# Patient Record
Sex: Female | Born: 1985 | Race: White | Hispanic: No | Marital: Married | Smoking: Never smoker
Health system: Southern US, Community
[De-identification: ages and names within clinical notes are randomized; demographics above are authoritative.]

## PROBLEM LIST (undated history)

## (undated) DIAGNOSIS — G43909 Migraine, unspecified, not intractable, without status migrainosus: Secondary | ICD-10-CM

## (undated) DIAGNOSIS — J42 Unspecified chronic bronchitis: Secondary | ICD-10-CM

## (undated) DIAGNOSIS — E039 Hypothyroidism, unspecified: Secondary | ICD-10-CM

## (undated) DIAGNOSIS — F419 Anxiety disorder, unspecified: Secondary | ICD-10-CM

## (undated) DIAGNOSIS — I10 Essential (primary) hypertension: Secondary | ICD-10-CM

## (undated) DIAGNOSIS — J189 Pneumonia, unspecified organism: Secondary | ICD-10-CM

## (undated) DIAGNOSIS — Z8679 Personal history of other diseases of the circulatory system: Secondary | ICD-10-CM

## (undated) DIAGNOSIS — E282 Polycystic ovarian syndrome: Secondary | ICD-10-CM

## (undated) HISTORY — PX: HERNIA REPAIR: SHX51

## (undated) HISTORY — PX: GANGLION CYST EXCISION: SHX1691

## (undated) HISTORY — PX: MIDDLE EAR SURGERY: SHX713

## (undated) HISTORY — PX: TYMPANOSTOMY TUBE PLACEMENT: SHX32

## (undated) HISTORY — PX: WISDOM TOOTH EXTRACTION: SHX21

---
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
## 2007-01-11 HISTORY — PX: LAPAROSCOPIC CHOLECYSTECTOMY: SUR755

## 2011-04-24 LAB — HIV ANTIBODY (ROUTINE TESTING W REFLEX): HIV: NONREACTIVE

## 2011-04-30 LAB — ABO/RH

## 2011-04-30 LAB — RUBELLA ANTIBODY, IGM: Rubella: IMMUNE

## 2011-04-30 LAB — RPR: RPR: NONREACTIVE

## 2011-07-23 ENCOUNTER — Inpatient Hospital Stay (HOSPITAL_COMMUNITY): Admission: AD | Admit: 2011-07-23 | Payer: Self-pay | Source: Ambulatory Visit | Admitting: Obstetrics and Gynecology

## 2011-10-09 ENCOUNTER — Encounter (HOSPITAL_COMMUNITY): Payer: Self-pay | Admitting: *Deleted

## 2011-10-09 ENCOUNTER — Inpatient Hospital Stay (HOSPITAL_COMMUNITY)
Admission: AD | Admit: 2011-10-09 | Discharge: 2011-10-09 | Disposition: A | Discharge status: Home / Self Care | Payer: Medicaid Other | Source: Ambulatory Visit | Attending: Obstetrics and Gynecology | Admitting: Obstetrics and Gynecology

## 2011-10-09 DIAGNOSIS — O368190 Decreased fetal movements, unspecified trimester, not applicable or unspecified: Secondary | ICD-10-CM | POA: Insufficient documentation

## 2011-10-09 DIAGNOSIS — E282 Polycystic ovarian syndrome: Secondary | ICD-10-CM

## 2011-10-09 DIAGNOSIS — Z3490 Encounter for supervision of normal pregnancy, unspecified, unspecified trimester: Secondary | ICD-10-CM

## 2011-10-09 DIAGNOSIS — Z8759 Personal history of other complications of pregnancy, childbirth and the puerperium: Secondary | ICD-10-CM

## 2011-10-09 DIAGNOSIS — J45909 Unspecified asthma, uncomplicated: Secondary | ICD-10-CM

## 2011-10-09 DIAGNOSIS — Z98891 History of uterine scar from previous surgery: Secondary | ICD-10-CM

## 2011-10-09 DIAGNOSIS — E039 Hypothyroidism, unspecified: Secondary | ICD-10-CM

## 2011-10-09 HISTORY — DX: Anxiety disorder, unspecified: F41.9

## 2011-10-09 HISTORY — DX: Polycystic ovarian syndrome: E28.2

## 2011-10-09 HISTORY — DX: Hypothyroidism, unspecified: E03.9

## 2011-10-09 NOTE — ED Provider Notes (Signed)
History     Chief Complaint  Patient presents with  . Decreased Fetal Movement   HPI Comments: Pt is a G3P01 at [redacted]w[redacted]d that arrived after calling office with cc of decreased fetal movement yesterday and today. Stated "baby has moved, but not as much as usual" has no other complaints, no ctx, no VB or LOF.  Pregnancy significant for:  1. PCOS 2. Hypothyroidism 3. Asthma 4. Hx c/s for HELLP syndrome at 31wks       Past Medical History  Diagnosis Date  . Asthma   . Polycystic ovarian syndrome   . Anxiety   . Hypothyroidism   . Cystitis     Past Surgical History  Procedure Date  . Cesarean section   . Cyst removed from wrist   . Cholecystectomy   . Tonsillectomy and adenoidectomy   . Eardrum repair   . Wisdom tooth extraction     Family History  Problem Relation Age of Onset  . Anesthesia problems Neg Hx     History  Substance Use Topics  . Smoking status: Never Smoker   . Smokeless tobacco: Never Used  . Alcohol Use: No    Allergies: No Known Allergies  Prescriptions prior to admission  Medication Sig Dispense Refill  . Cholecalciferol (VITAMIN D3) 400 UNITS CAPS Take 1 capsule by mouth daily.        . Docosahexaenoic Acid (DHA COMPLETE PO) Take 1 capsule by mouth daily.        Marland Kitchen levothyroxine (SYNTHROID, LEVOTHROID) 125 MCG tablet Take 125 mcg by mouth daily. On Saturdays and Sundays take 1 and 1/2 tabs (187.17mcg)       . prenatal vitamin w/FE, FA (PRENATAL 1 + 1) 27-1 MG TABS Take 1 tablet by mouth daily.          Review of Systems  All other systems reviewed and are negative.   Physical Exam   Blood pressure 102/54, pulse 93, temperature 98.3 F (36.8 C), temperature source Oral, resp. rate 18, height 5\' 4"  (1.626 m), weight 126.554 kg (279 lb).  Physical Exam  Nursing note and vitals reviewed. Constitutional: She is oriented to person, place, and time. She appears well-developed and well-nourished.  Cardiovascular: Normal rate.   Respiratory:  Effort normal.  Musculoskeletal: Normal range of motion.  Neurological: She is alert and oriented to person, place, and time.  Skin: Skin is warm and dry.  Psychiatric: She has a normal mood and affect. Her behavior is normal.   FHR 130 reactive, audible fetal movement TOCO quiet  MAU Course  Procedures    Assessment and Plan  IUP at 28w NST reactive Pt feels lots of FM now  D/C home F/U routine [facility]   Anders Hohmann M 10/09/2011, 7:41 PM

## 2011-10-09 NOTE — Progress Notes (Signed)
Decreased FM X 2 days

## 2011-11-12 ENCOUNTER — Encounter (HOSPITAL_COMMUNITY): Payer: Self-pay | Admitting: *Deleted

## 2011-11-12 ENCOUNTER — Inpatient Hospital Stay (HOSPITAL_COMMUNITY)
Admission: AD | Admit: 2011-11-12 | Discharge: 2011-11-12 | Disposition: A | Discharge status: Home / Self Care | Payer: Medicaid Other | Source: Ambulatory Visit | Attending: Obstetrics and Gynecology | Admitting: Obstetrics and Gynecology

## 2011-11-12 DIAGNOSIS — O368190 Decreased fetal movements, unspecified trimester, not applicable or unspecified: Secondary | ICD-10-CM | POA: Insufficient documentation

## 2011-11-12 NOTE — Progress Notes (Signed)
Pt here for decreased fm, states she felt mvmt on way to MAU, denies abnormal vaginal d/c changes or bleeding, denies pain also. +FM at present.

## 2011-11-13 NOTE — ED Provider Notes (Signed)
History     Chief Complaint  Patient presents with  . Decreased Fetal Movement   HPI Comments: Pt is a G3P0111 at [redacted]w[redacted]d w cc of decreased FM since last night, has felt some movement throughout the day but not as much as usual. Now in MAU feels lots of movement. Denies any pain, ctx, VB or LOF.       Past Medical History  Diagnosis Date  . Asthma   . Polycystic ovarian syndrome   . Anxiety   . Hypothyroidism   . Cystitis     Past Surgical History  Procedure Date  . Cesarean section   . Cyst removed from wrist   . Cholecystectomy   . Tonsillectomy and adenoidectomy   . Eardrum repair   . Wisdom tooth extraction     Family History  Problem Relation Age of Onset  . Anesthesia problems Neg Hx     History  Substance Use Topics  . Smoking status: Never Smoker   . Smokeless tobacco: Never Used  . Alcohol Use: No    Allergies: No Known Allergies  No prescriptions prior to admission    Review of Systems  All other systems reviewed and are negative.   Physical Exam   Blood pressure 130/64, pulse 80, temperature 98.9 F (37.2 C), temperature source Oral, resp. rate 18, height 5\' 4"  (1.626 m), weight 131.09 kg (289 lb).  Physical Exam  Nursing note and vitals reviewed. Constitutional: She is oriented to person, place, and time. She appears well-developed and well-nourished.  Cardiovascular: Normal rate.   Respiratory: Effort normal.  GI: Soft.  Musculoskeletal: Normal range of motion.  Neurological: She is alert and oriented to person, place, and time.  Skin: Skin is warm and dry.  Psychiatric: She has a normal mood and affect. Her behavior is normal.   FHR 130 mod variability, rare <10sec variable, reassuring for gest age, CAT I toco - some irritability  MAU Course  Procedures    Assessment and Plan  IUP at [redacted]w[redacted]d FHR reassuring  Has f/u appt in office Friday  [facility] PTL precautions D/C home  Lucius Wise M 11/13/2011, 1:19 AM

## 2011-11-13 NOTE — L&D Delivery Note (Signed)
Delivery Note Pt assisted out of tub around 0025, and able to void.  Swayed at bedside w/ contractions for about 15 minutes, and offered AROM.  Pt agreeable to proceed w/ AROM, and at 0051, AROM large amt of clear fluid.  Cx was still 8cm at that time.  With next contraction, pt feeling more pressure, and helped her back into tub.  With next 2 contractions, feeling more urge to push, and stated she felt like head had delivered.  With exam, head had delivered, and subsequently helped pt from off knees to sitting position, and newborn's body delivered without difficulty.  At 1:07 AM a viable female "Zenda Alpers" was delivered via Vaginal, Spontaneous Delivery (Presentation: Right Occiput Anterior).  APGAR: 8, 9; weight--not yet measured at time of this note.  Cord doubly clamped and cut by FOB before assisting pt out of tub. Placenta status: Intact, Spontaneous.  Cord: 3 vessels with the following complications: true Knot.  Cord pH: n/a.  Anesthesia: None  Episiotomy: None Lacerations: 3rd degree with Rt sulcus Suture Repair: 2.0 & 3.0 vicryl Est. Blood Loss (mL): <500 Repair by Dr. Pennie Rushing (please see op note).  Mom to postpartum.  Baby to nursery-stable. Skin to skin in recovery, both by pt and FOB. Pt declines circumcision. Successful VBAC /H20birth. Milda Lindvall H 12/31/2011, 3:25 AM

## 2011-12-28 ENCOUNTER — Inpatient Hospital Stay (HOSPITAL_COMMUNITY)
Admission: AD | Admit: 2011-12-28 | Discharge: 2011-12-29 | Disposition: A | Discharge status: Home / Self Care | Payer: Medicaid Other | Source: Ambulatory Visit | Attending: Obstetrics and Gynecology | Admitting: Obstetrics and Gynecology

## 2011-12-28 DIAGNOSIS — O368190 Decreased fetal movements, unspecified trimester, not applicable or unspecified: Secondary | ICD-10-CM | POA: Insufficient documentation

## 2011-12-28 DIAGNOSIS — O479 False labor, unspecified: Secondary | ICD-10-CM | POA: Insufficient documentation

## 2011-12-28 NOTE — Progress Notes (Signed)
Pt states, " I've had contractions for two days, but not like active labor. I noticed at 6:30 pm I noticed that I hadn't feel him since the afternoon."

## 2011-12-29 ENCOUNTER — Other Ambulatory Visit: Payer: Self-pay | Admitting: Obstetrics and Gynecology

## 2011-12-29 ENCOUNTER — Encounter (HOSPITAL_COMMUNITY): Payer: Self-pay | Admitting: *Deleted

## 2011-12-29 LAB — COMPREHENSIVE METABOLIC PANEL
ALT: 12 U/L (ref 0–35)
Albumin: 2.6 g/dL — ABNORMAL LOW (ref 3.5–5.2)
Alkaline Phosphatase: 113 U/L (ref 39–117)
BUN: 7 mg/dL (ref 6–23)
Potassium: 4 mEq/L (ref 3.5–5.1)
Sodium: 133 mEq/L — ABNORMAL LOW (ref 135–145)
Total Protein: 6 g/dL (ref 6.0–8.3)

## 2011-12-29 LAB — URINALYSIS, ROUTINE W REFLEX MICROSCOPIC
Ketones, ur: NEGATIVE mg/dL
Leukocytes, UA: NEGATIVE
Nitrite: NEGATIVE
Protein, ur: NEGATIVE mg/dL
Urobilinogen, UA: 0.2 mg/dL (ref 0.0–1.0)

## 2011-12-29 LAB — CBC
MCHC: 33.1 g/dL (ref 30.0–36.0)
Platelets: 250 10*3/uL (ref 150–400)
RDW: 16 % — ABNORMAL HIGH (ref 11.5–15.5)

## 2011-12-29 LAB — URIC ACID: Uric Acid, Serum: 5.8 mg/dL (ref 2.4–7.0)

## 2011-12-29 NOTE — Discharge Instructions (Signed)
Fetal Monitoring, Fetal Movement Assessment Fetal movement assessment (FMA) is done by the pregnant woman herself by counting and recording the baby's movements over a certain time period. It is done to see if there are problems with the pregnancy and the baby. Identifying and correcting problems may prevent serious problems from developing with the fetus, including fetal loss. Some pregnancies are complicated by the mother's medical problems. Some of these problems are type 1 diabetes mellitus, high blood pressure and other chronic medical illnesses. This is why it is important to monitor the baby before birth.  OTHER TECHNIQUES OF MONITORING YOUR BABY BEFORE BIRTH Several tests are in use. These include:  Nonstress test (NST). This test monitors the baby's heart rate when the baby moves.   Contraction stress test (CST). This test monitors the baby's heart rate during a contraction of the uterus.   Fetal biophysical profile (BPP). This measures and evaluates 5 observations of the baby:   The nonstress test.   The baby's breathing.   The baby's movements.   The baby's muscle tone.   The amount of amniotic fluid.   Modified BPP. This measures the volume of fluid in different parts of the amniotic sac (amniotic fluid index) and the results of the nonstress test.   Umbilical artery doppler velocimetry. This evaluates the blood flow through the umbilical cord.  There are several very serious problems that cannot be predicted or detected with any of the fetal monitoring procedures. These problems include separation (abruption) of the placenta or when the fetus chokes on the umbilical cord (umbilical cord accident). Your caregiver will help you understand your tests and what they mean for you and your baby. It is your responsibility to obtain the results of your test. LET YOUR CAREGIVER KNOW ABOUT:   Any medications you are taking including prescription and over-the-counter drugs, herbs, eye  drops and creams.   If you have a fever.   If you have an infection.   If you are sick.  RISKS AND COMPLICATIONS  There are no risks or complications to the mother or fetus with FMA. BEFORE THE PROCEDURE  Do not take medications that may decrease or increase the baby's heart rate and/or movements.   Eat a full meal at least 2 hours before the test.   Do not smoke if you are pregnant. If you smoke, stop at least 2 days before the test. It is best not to smoke at all when you are pregnant.  PROCEDURE Sometimes, a mother notices her baby moves less before there are problems. Because of this, it is believed that fetal movement checking by the mother (kick counts) is a good way to check the baby before birth. There are different ways of doing this. Two good ways are:  The woman lies on her side and counts distinct (individual) fetal movements. A feeling of 10 distinct movements in a period of up to 2 hours is considered reassuring. When 10 movements are felt, you may stop counting.   Women are instructed to count fetal movements for 1 hour, three times per week. The count is good if, after one week, it equals or is over the woman's previously established baseline count. If the count is lower, further checking of your baby is needed.  AFTER THE PROCEDURE You may resume your usual activities. HOME CARE INSTRUCTIONS   Follow your caregiver's advice and recommendations.   Be aware of your baby's movements. Are they normal, less than usual or more than usual?     Make and keep the rest of your prenatal appointments.  SEEK MEDICAL CARE IF:   You develop a temperature of 100 F (37.8 C) or higher.   You have a bloody mucus discharge from the vagina (a bloody show).  SEEK IMMEDIATE MEDICAL CARE IF:   You do not feel the baby move.   You think the baby's movements are too little or too many.   You develop contractions.   You develop vaginal bleeding.   You have belly (abdominal) pain.    You have leaking or a gush of fluid from the vagina.  Document Released: 10/19/2002 Document Revised: 07/11/2011 Document Reviewed: 02/21/2009 [facility] Patient Information 2012 [place], [place].Braxton Hicks Contractions Pregnancy is commonly associated with contractions of the uterus throughout the pregnancy. Towards the end of pregnancy (32 to 34 weeks), these contractions [facility] [name]) can develop more often and may become more forceful. This is not true labor because these contractions do not result in opening (dilatation) and thinning of the cervix. They are sometimes difficult to tell apart from true labor because these contractions can be forceful and people have different pain tolerances. You should not feel embarrassed if you go to the hospital with false labor. Sometimes, the only way to tell if you are in true labor is for your caregiver to follow the changes in the cervix. How to tell the difference between true and false labor:  False labor.   The contractions of false labor are usually shorter, irregular and not as hard as those of true labor.   They are often felt in the front of the lower abdomen and in the groin.   They may leave with walking around or changing positions while lying down.   They get weaker and are shorter lasting as time goes on.   These contractions are usually irregular.   They do not usually become progressively stronger, regular and closer together as with true labor.   True labor.   Contractions in true labor last 30 to 70 seconds, become very regular, usually become more intense, and increase in frequency.   They do not go away with walking.   The discomfort is usually felt in the top of the uterus and spreads to the lower abdomen and low back.   True labor can be determined by your caregiver with an exam. This will show that the cervix is dilating and getting thinner.  If there are no prenatal problems or other health problems associated with  the pregnancy, it is completely safe to be sent home with false labor and await the onset of true labor. HOME CARE INSTRUCTIONS   Keep up with your usual exercises and instructions.   Take medications as directed.   Keep your regular prenatal appointment.   Eat and drink lightly if you think you are going into labor.   If BH contractions are making you uncomfortable:   Change your activity position from lying down or resting to walking/walking to resting.   Sit and rest in a tub of warm water.   Drink 2 to 3 glasses of water. Dehydration may cause B-H contractions.   Do slow and deep breathing several times an hour.  SEEK IMMEDIATE MEDICAL CARE IF:   Your contractions continue to become stronger, more regular, and closer together.   You have a gushing, burst or leaking of fluid from the vagina.   An oral temperature above 102 F (38.9 C) develops.   You have passage of blood-tinged mucus.  You develop vaginal bleeding.   You develop continuous belly (abdominal) pain.   You have low back pain that you never had before.   You feel the baby's head pushing down causing pelvic pressure.   The baby is not moving as much as it used to.  Document Released: 10/29/2005 Document Revised: 07/11/2011 Document Reviewed: 04/22/2009 [facility] Patient Information 2012 [place], [place].

## 2011-12-29 NOTE — ED Provider Notes (Signed)
History   26 yo G0111 at 85 5/7 weeks presented c/o decreased FM tonight--feels she is not as aware of FM due to contractions.  Denies leaking or bleeding, reports UCs irregular last 24 hour--mild, some moderate.  Cervix has been 2 cm in office.  Denies HA, visual symptoms, or epigastric pain.  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 Elevated BMI Hx anxiety Hypothyroidism Hx PCOS Asthma GBS negative     OB History    Grav Para Term Preterm Abortions TAB SAB Ect Mult Living   3 1 0 1 1 0 1 0 0 1       Past Medical History  Diagnosis Date  . Asthma   . Polycystic ovarian syndrome   . Anxiety   . Hypothyroidism   . Cystitis     Past Surgical History  Procedure Date  . Cesarean section   . Cyst removed from wrist   . Cholecystectomy   . Tonsillectomy and adenoidectomy   . Eardrum repair   . Wisdom tooth extraction     Family History  Problem Relation Age of Onset  . Anesthesia problems Neg Hx   . Arthritis Father   . Hypertension Maternal Grandmother   . Diabetes Maternal Grandmother   . Diabetes Paternal Grandmother   . Hypertension Paternal Grandmother   . Heart disease Paternal Grandmother   . Cancer Paternal Grandfather     History  Substance Use Topics  . Smoking status: Never Smoker   . Smokeless tobacco: Never Used  . Alcohol Use: No    Allergies: No Known Allergies    Physical Exam  BP 120-140/60-80s, with one value just after arrival of 140/88. Other VSS Results for orders placed during the hospital encounter of 12/28/11 (from the past 24 hour(s))  CBC     Status: Abnormal   Collection Time   12/29/11 12:30 AM      Component Value Range   WBC 12.7 (*) 4.0 - 10.5 (K/uL)   RBC 4.66  3.87 - 5.11 (MIL/uL)   Hemoglobin 13.1  12.0 - 15.0 (g/dL)   HCT 56.2  13.0 - 86.5 (%)   MCV 85.0  78.0 - 100.0 (fL)   MCH 28.1  26.0 - 34.0  (pg)   MCHC 33.1  30.0 - 36.0 (g/dL)   RDW 78.4 (*) 69.6 - 15.5 (%)   Platelets 250  150 - 400 (K/uL)  COMPREHENSIVE METABOLIC PANEL     Status: Abnormal   Collection Time   12/29/11 12:30 AM      Component Value Range   Sodium 133 (*) 135 - 145 (mEq/L)   Potassium 4.0  3.5 - 5.1 (mEq/L)   Chloride 101  96 - 112 (mEq/L)   CO2 23  19 - 32 (mEq/L)   Glucose, Bld 81  70 - 99 (mg/dL)   BUN 7  6 - 23 (mg/dL)   Creatinine, Ser 2.95  0.50 - 1.10 (mg/dL)   Calcium 9.2  8.4 - 28.4 (mg/dL)   Total Protein 6.0  6.0 - 8.3 (g/dL)   Albumin 2.6 (*) 3.5 - 5.2 (g/dL)   AST 13  0 - 37 (U/L)   ALT 12  0 - 35 (U/L)   Alkaline Phosphatase 113  39 - 117 (U/L)   Total Bilirubin 0.2 (*) 0.3 - 1.2 (mg/dL)   GFR calc non Af Amer >90  >90 (mL/min)   GFR calc Af  Amer >90  >90 (mL/min)  URIC ACID     Status: Normal   Collection Time   12/29/11 12:30 AM      Component Value Range   Uric Acid, Serum 5.8  2.4 - 7.0 (mg/dL)  LACTATE DEHYDROGENASE     Status: Normal   Collection Time   12/29/11 12:30 AM      Component Value Range   LD 145  94 - 250 (U/L)  URINALYSIS, ROUTINE W REFLEX MICROSCOPIC     Status: Normal   Collection Time   12/29/11 12:49 AM      Component Value Range   Color, Urine YELLOW  YELLOW    APPearance CLEAR  CLEAR    Specific Gravity, Urine 1.020  1.005 - 1.030    pH 6.5  5.0 - 8.0    Glucose, UA NEGATIVE  NEGATIVE (mg/dL)   Hgb urine dipstick NEGATIVE  NEGATIVE    Bilirubin Urine NEGATIVE  NEGATIVE    Ketones, ur NEGATIVE  NEGATIVE (mg/dL)   Protein, ur NEGATIVE  NEGATIVE (mg/dL)   Urobilinogen, UA 0.2  0.0 - 1.0 (mg/dL)   Nitrite NEGATIVE  NEGATIVE    Leukocytes, UA NEGATIVE  NEGATIVE    FHR reactive on NST, with patient now aware of frequent FM Irregular mild UCs, Declines VE.    ED Course  IUP at 39 5/7 weeks Reassuring fetal status   Plan: D/C home, with [facility] [place] and labor precautions. Will continue to monitor for BP issues. F/U as scheduled in office on Tuesday or  prn.  Nigel Bridgeman, CNM, [place] 12/29/11 1:30am

## 2011-12-29 NOTE — ED Notes (Signed)
Manfred Arch CNM in to see pt. EFM strip reviewed.

## 2011-12-29 NOTE — Progress Notes (Signed)
Audrey Greer in to discuss d/c plan. Written and verbal d/c instructions given and understanding voiced.

## 2011-12-29 NOTE — Progress Notes (Signed)
Manfred Arch CNM called unit. Aware of pt's admission and status. Putting in orders. Pt aware.

## 2011-12-29 NOTE — ED Notes (Signed)
Manfred Arch CNM notified of reactive FHR. OK to d/c efm. Baby active. Pt up to BR

## 2011-12-30 ENCOUNTER — Encounter (HOSPITAL_COMMUNITY): Payer: Self-pay | Admitting: *Deleted

## 2011-12-30 ENCOUNTER — Encounter (HOSPITAL_COMMUNITY): Payer: Self-pay | Admitting: Anesthesiology

## 2011-12-30 ENCOUNTER — Other Ambulatory Visit: Payer: Self-pay | Admitting: Obstetrics and Gynecology

## 2011-12-30 ENCOUNTER — Inpatient Hospital Stay (HOSPITAL_COMMUNITY)
Admission: AD | Admit: 2011-12-30 | Discharge: 2012-01-02 | DRG: 775 | Disposition: A | Discharge status: Home / Self Care | Payer: Medicaid Other | Source: Ambulatory Visit | Attending: Obstetrics and Gynecology | Admitting: Obstetrics and Gynecology

## 2011-12-30 ENCOUNTER — Inpatient Hospital Stay (HOSPITAL_COMMUNITY)
Admission: AD | Admit: 2011-12-30 | Discharge: 2011-12-30 | Disposition: A | Discharge status: Home / Self Care | Payer: Medicaid Other | Source: Ambulatory Visit | Attending: Obstetrics and Gynecology | Admitting: Obstetrics and Gynecology

## 2011-12-30 DIAGNOSIS — O479 False labor, unspecified: Secondary | ICD-10-CM | POA: Insufficient documentation

## 2011-12-30 DIAGNOSIS — O34219 Maternal care for unspecified type scar from previous cesarean delivery: Secondary | ICD-10-CM | POA: Diagnosis present

## 2011-12-30 DIAGNOSIS — E079 Disorder of thyroid, unspecified: Secondary | ICD-10-CM | POA: Diagnosis present

## 2011-12-30 DIAGNOSIS — O99284 Endocrine, nutritional and metabolic diseases complicating childbirth: Secondary | ICD-10-CM | POA: Diagnosis present

## 2011-12-30 DIAGNOSIS — O368190 Decreased fetal movements, unspecified trimester, not applicable or unspecified: Secondary | ICD-10-CM | POA: Insufficient documentation

## 2011-12-30 DIAGNOSIS — E039 Hypothyroidism, unspecified: Secondary | ICD-10-CM | POA: Diagnosis present

## 2011-12-30 LAB — COMPREHENSIVE METABOLIC PANEL
ALT: 14 U/L (ref 0–35)
Albumin: 2.7 g/dL — ABNORMAL LOW (ref 3.5–5.2)
Alkaline Phosphatase: 124 U/L — ABNORMAL HIGH (ref 39–117)
Chloride: 102 mEq/L (ref 96–112)
Potassium: 4.2 mEq/L (ref 3.5–5.1)
Sodium: 136 mEq/L (ref 135–145)
Total Protein: 6.3 g/dL (ref 6.0–8.3)

## 2011-12-30 LAB — CBC
HCT: 41 % (ref 36.0–46.0)
Platelets: 251 10*3/uL (ref 150–400)
RBC: 4.82 MIL/uL (ref 3.87–5.11)
RDW: 15.9 % — ABNORMAL HIGH (ref 11.5–15.5)
WBC: 13 10*3/uL — ABNORMAL HIGH (ref 4.0–10.5)

## 2011-12-30 MED ORDER — ACETAMINOPHEN 325 MG PO TABS: 650.0000 mg | ORAL_TABLET | ORAL | Status: DC | PRN

## 2011-12-30 MED ORDER — LACTATED RINGERS IV SOLN
INTRAVENOUS | Status: DC
  Administered 2011-12-31: 02:00:00 via INTRAVENOUS

## 2011-12-30 MED ORDER — LIDOCAINE HCL (PF) 1 % IJ SOLN
30.0000 mL | INTRAMUSCULAR | Status: AC | PRN
  Administered 2011-12-31 (×2): 30 mL via SUBCUTANEOUS
  Filled 2011-12-30 (×2): qty 30

## 2011-12-30 MED ORDER — ONDANSETRON HCL 4 MG/2ML IJ SOLN: 4.0000 mg | Freq: Four times a day (QID) | INTRAMUSCULAR | Status: DC | PRN

## 2011-12-30 MED ORDER — OXYTOCIN 10 UNIT/ML IJ SOLN
10.0000 [IU] | Freq: Once | INTRAMUSCULAR | Status: DC
  Filled 2011-12-30: qty 2

## 2011-12-30 MED ORDER — LACTATED RINGERS IV SOLN: 500.0000 mL | INTRAVENOUS | Status: DC | PRN

## 2011-12-30 MED ORDER — OXYTOCIN 20 UNITS IN LACTATED RINGERS INFUSION - SIMPLE: 125.0000 mL/h | Freq: Once | INTRAVENOUS | Status: DC

## 2011-12-30 MED ORDER — CITRIC ACID-SODIUM CITRATE 334-500 MG/5ML PO SOLN: 30.0000 mL | ORAL | Status: DC | PRN

## 2011-12-30 MED ORDER — IBUPROFEN 600 MG PO TABS
600.0000 mg | ORAL_TABLET | Freq: Four times a day (QID) | ORAL | Status: DC | PRN
  Administered 2011-12-31: 600 mg via ORAL
  Filled 2011-12-30: qty 1

## 2011-12-30 MED ORDER — OXYTOCIN BOLUS FROM INFUSION
500.0000 mL | Freq: Once | INTRAVENOUS | Status: DC
  Filled 2011-12-30: qty 500

## 2011-12-30 MED ORDER — OXYCODONE-ACETAMINOPHEN 5-325 MG PO TABS: 1.0000 | ORAL_TABLET | ORAL | Status: DC | PRN

## 2011-12-30 NOTE — Discharge Instructions (Signed)
Please call the office even after hours to speak to a provider to help [place] determine if you need to be evaluated at the hospital.    [facility] Contractions - also known as false labor  Pregnancy is commonly associated with contractions of the uterus throughout the pregnancy. Towards the end of pregnancy (32 to 34 weeks), these contractions [name]) can develop more often and may become more forceful. This is not true labor because these contractions do not result in opening (dilatation) and thinning of the cervix. They are sometimes difficult to tell apart from true labor because these contractions can be forceful and people have different pain tolerances. You should not feel embarrassed if you go to the hospital with false labor. Sometimes, the only way to tell if you are in true labor is for your caregiver to follow the changes in the cervix. How to tell the difference between true and false labor:  False labor.   The contractions of false labor are usually shorter, irregular and not as hard as those of true labor.   They are often felt in the front of the lower abdomen and in the groin.   They may leave with walking around or changing positions while lying down.   They get weaker and are shorter lasting as time goes on.   These contractions are usually irregular.   They do not usually become progressively stronger, regular and closer together as with true labor.   True labor.   Contractions in true labor last 30 to 70 seconds, become very regular, usually become more intense, and increase in frequency.   They do not go away with walking.   The discomfort is usually felt in the top of the uterus and spreads to the lower abdomen and low back.   True labor can be determined by your caregiver with an exam. This will show that the cervix is dilating and getting thinner.  If there are no prenatal problems or other health problems associated with the pregnancy, it is completely  safe to be sent home with false labor and await the onset of true labor. HOME CARE INSTRUCTIONS   Keep up with your usual exercises and instructions.   Take medications as directed. You may take extra strength tylenol for discomfort, you may also take 50mg  benedryl to help you sleep.   Keep your regular prenatal appointment.   Eat and drink lightly if you think you are going into labor.   If BH contractions are making you uncomfortable:   Change your activity position from lying down or resting to walking/walking to resting.   Sit and rest in a tub of warm water.   Drink at least 64oz of water/day. If you having a run of contractions, drink 2-3 large cold glasses of ice water.. Dehydration may cause more ineffective contractions.   Do slow and deep breathing several times an hour. Try to rest and relax as much as you can, mild exercise is fine, but do not try to wear yourself out.   Walking for long periods of time will not MAKE you go into labor SEEK IMMEDIATE MEDICAL CARE IF:   Your contractions continue to become stronger, more regular, and closer together.   You have a gushing, burst or leaking of fluid from the vagina.   An oral temperature above 102 F (38.9 C) develops.   You have passage of blood-tinged mucus.   You develop vaginal bleeding. - Spotting can be normal after a  vaginal exam or having sex.   You develop continuous belly (abdominal) pain.   You have low back pain that you never had before.   You feel the baby's head pushing down causing pelvic pressure.   The baby is not moving as much as it used to.  Document Released: 10/29/2005 Document Revised: 07/11/2011 Document Reviewed: 04/22/2009 [facility] Patient Information 2012 [place], [place].  Fetal Movement Counts Patient Name: __________________________________________________ Patient Due Date: ____________________ Audrey Greer counts is highly recommended in high risk pregnancies, but it is a good idea for  every pregnant woman to do. Start counting fetal movements at 28 weeks of the pregnancy. Fetal movements increase after eating a full meal or eating or drinking something sweet (the blood sugar is higher). It is also important to drink plenty of fluids (well hydrated) before doing the count. Lie on your left side because it helps with the circulation or you can sit in a comfortable chair with your arms over your belly (abdomen) with no distractions around you. DOING THE COUNT  Try to do the count the same time of day each time you do it.   Mark the day and time, then see how long it takes for you to feel 10 movements (kicks, flutters, swishes, rolls). You should have at least 10 movements within 2 hours. You will most likely feel 10 movements in much less than 2 hours. If you do not, wait an hour and count again. After a couple of days you will see a pattern.   What you are looking for is a change in the pattern or not enough counts in 2 hours. Is it taking longer in time to reach 10 movements?  SEEK MEDICAL CARE IF:  You feel less than 10 counts in 2 hours. Tried twice.   No movement in one hour.   The pattern is changing or taking longer each day to reach 10 counts in 2 hours.   You feel the baby is not moving as it usually does.  Date: ____________ Movements: ____________ Start time: ____________ [name] time: ____________  Date: ____________ Movements: ____________ Start time: ____________ [name] time: ____________ Date: ____________ Movements: ____________ Start time: ____________ [name] time: ____________ Date: ____________ Movements: ____________ Start time: ____________ [name] time: ____________ Date: ____________ Movements: ____________ Start time: ____________ [name] time: ____________ Date: ____________ Movements: ____________ Start time: ____________ [name] time: ____________ Date: ____________ Movements: ____________ Start time: ____________ [name] time: ____________ Date:  ____________ Movements: ____________ Start time: ____________ [name] time: ____________  Date: ____________ Movements: ____________ Start time: ____________ [name] time: ____________ Date: ____________ Movements: ____________ Start time: ____________ [name] time: ____________ Date: ____________ Movements: ____________ Start time: ____________ [name] time: ____________ Date: ____________ Movements: ____________ Start time: ____________ [name] time: ____________ Date: ____________ Movements: ____________ Start time: ____________ [name] time: ____________ Date: ____________ Movements: ____________ Start time: ____________ [name] time: ____________ Date: ____________ Movements: ____________ Start time: ____________ [name] time: ____________  Date: ____________ Movements: ____________ Start time: ____________ [name] time: ____________ Date: ____________ Movements: ____________ Start time: ____________ [name] time: ____________ Date: ____________ Movements: ____________ Start time: ____________ [name] time: ____________ Date: ____________ Movements: ____________ Start time: ____________ [name] time: ____________ Date: ____________ Movements: ____________ Start time: ____________ [name] time: ____________ Date: ____________ Movements: ____________ Start time: ____________ [name] time: ____________ Date: ____________ Movements: ____________ Start time: ____________ [name] time: ____________  Date: ____________ Movements: ____________ Start time: ____________ [name] time: ____________ Date: ____________ Movements: ____________ Start time: ____________ [name] time: ____________ Date: ____________ Movements: ____________ Start time: ____________ [name] time: ____________  Date: ____________ Movements: ____________ Start time: ____________ [name] time: ____________ Date: ____________ Movements: ____________ Start time: ____________ [name] time: ____________ Date: ____________ Movements: ____________ Start  time: ____________ [name] time: ____________ Date: ____________ Movements: ____________ Start time: ____________ [name] time: ____________  Date: ____________ Movements: ____________ Start time: ____________ [name] time: ____________ Date: ____________ Movements: ____________ Start time: ____________ [name] time: ____________ Date: ____________ Movements: ____________ Start time: ____________ [name] time: ____________ Date: ____________ Movements: ____________ Start time: ____________ [name] time: ____________ Date: ____________ Movements: ____________ Start time: ____________ [name] time: ____________ Date: ____________ Movements: ____________ Start time: ____________ [name] time: ____________ Date: ____________ Movements: ____________ Start time: ____________ [name] time: ____________  Date: ____________ Movements: ____________ Start time: ____________ [name] time: ____________ Date: ____________ Movements: ____________ Start time: ____________ [name] time: ____________ Date: ____________ Movements: ____________ Start time: ____________ [name] time: ____________ Date: ____________ Movements: ____________ Start time: ____________ [name] time: ____________ Date: ____________ Movements: ____________ Start time: ____________ [name] time: ____________ Date: ____________ Movements: ____________ Start time: ____________ [name] time: ____________ Date: ____________ Movements: ____________ Start time: ____________ [name] time: ____________  Date: ____________ Movements: ____________ Start time: ____________ [name] time: ____________ Date: ____________ Movements: ____________ Start time: ____________ [name] time: ____________ Date: ____________ Movements: ____________ Start time: ____________ [name] time: ____________ Date: ____________ Movements: ____________ Start time: ____________ [name] time: ____________ Date: ____________ Movements: ____________ Start time: ____________ [name] time:  ____________ Date: ____________ Movements: ____________ Start time: ____________ [name] time: ____________ Date: ____________ Movements: ____________ Start time: ____________ [name] time: ____________  Date: ____________ Movements: ____________ Start time: ____________ [name] time: ____________ Date: ____________ Movements: ____________ Start time: ____________ [name] time: ____________ Date: ____________ Movements: ____________ Start time: ____________ [name] time: ____________ Date: ____________ Movements: ____________ Start time: ____________ [name] time: ____________ Date: ____________ Movements: ____________ Start time: ____________ [name] time: ____________ Date: ____________ Movements: ____________ Start time: ____________ [name] time: ____________ Document Released: 11/28/2006 Document Revised: 07/11/2011 Document Reviewed: 05/31/2009 [employer] Patient Information 2012 [employer].

## 2011-12-30 NOTE — H&P (Signed)
Audrey Greer is a 26 y.o. female presenting for stronger ctx. Pt was d/c'd home from MAU at about 1700, now returns with more reg and strong ctx, cx now 6cm/90/-1, BBOW, vtx. Pt denies VB, LOF, has GFM. Pt denies PIH s/s. Pt appears anxious, responds to direct coaching. S.O at bs, doula en route.   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  Elevated BMI  Hx anxiety  Hypothyroidism - managed by Dr. Talmage Nap Hx PCOS  Asthma - stable GBS negative  HPI: Pt began prenatal care at 6wks at [facility]. Pt was anxious after having SAB 3 mos prior. [place] normal at 6w with EDC of 2/18. Quants for followed and did rise appropriately. [place] for anatomy was done at 19wks, there were limited views, so it was repeated at 21wks and was normal. An early 1hr gtt was done at 19weeks and was elevated, a 3hr gtt was done and was normal. She then followed a routine prenatal course. She was seen in MAU several times for c/o decreased FM, and EFM was normal. GBS was done at 36wks and was negative. [place] at 36wks was WNL. Pt then had [place] for S>D at [redacted]w[redacted]d, and EFW was 70%, AFI 80%. Otherwise normal. Pt was seen in MAU for a labor eval on 2/16, PIH labs and urine were all normal at the time.    Maternal Medical History:  Reason for admission: Reason for admission: contractions.  Contractions: Onset was 6-12 hours ago.   Frequency: regular.   Duration is approximately 60 seconds.   Perceived severity is moderate.    Fetal activity: Perceived fetal activity is normal.   Last perceived fetal movement was within the past hour.    Prenatal complications: no prenatal complications   OB History    Grav Para Term Preterm Abortions TAB SAB Ect Mult Living   3 1 0 1 1 0 1 0 0 1      #1 12/07 female, 3#2oz, 31wks, C/S, HELLP, pre-eclampsia #2 4/12 SAB, no complications #3 current   Past Medical History  Diagnosis Date    . Asthma   . Polycystic ovarian syndrome   . Anxiety   . Hypothyroidism   . Cystitis    Past Surgical History  Procedure Date  . Cesarean section   . Cyst removed from wrist   . Cholecystectomy   . Tonsillectomy and adenoidectomy   . Eardrum repair   . Wisdom tooth extraction    Family History: family history includes Arthritis in her father; Cancer in her paternal grandfather; Diabetes in her maternal grandmother and paternal grandmother; Heart disease in her paternal grandmother; and Hypertension in her maternal grandmother and paternal grandmother.  There is no history of Anesthesia problems. Social History:  reports that she has never smoked. She has never used smokeless tobacco. She reports that she does not drink alcohol or use illicit drugs.  PT is SWF, 26yo, "John" FOB, involved and supportive. HS education  Review of Systems  All other systems reviewed and are negative.    Dilation: 6 Effacement (%): 90 Station: -1 Exam by:: S. Lillard, CNM There were no vitals taken for this visit. Maternal Exam:  Uterine Assessment: Contraction strength is moderate.  Contraction duration is 60 seconds. Contraction frequency is regular.   Abdomen: Patient reports no abdominal tenderness. Fundal height is aga.   Estimated fetal weight is 7-11.   Fetal presentation: vertex  Introitus: Normal vulva. Normal vagina.  Vagina is negative for discharge.  Ferning test: not done.   Pelvis: adequate for delivery.   Cervix: Cervix evaluated by digital exam.     Fetal Exam Fetal Monitor Review: Mode: ultrasound.   Baseline rate: 120.  Variability: moderate (6-25 bpm).   Pattern: accelerations present and no decelerations.    Fetal State Assessment: Category I - tracings are normal.     Physical Exam  Nursing note and vitals reviewed. Constitutional: She is oriented to person, place, and time. She appears well-developed and well-nourished. She appears distressed.       Pt very  anxious and nervous, breathes w ctx  HENT:  Head: Normocephalic.  Neck: Normal range of motion.  Cardiovascular: Normal rate and normal heart sounds.   Respiratory: Effort normal and breath sounds normal.  GI: Soft.       gravid  Genitourinary: Vagina normal. No vaginal discharge found.       VE=6/90/-1 BBOW, vtx  Musculoskeletal: Normal range of motion. She exhibits edema. She exhibits no tenderness.       2+ pedal edema 2+ BLE edema  Neurological: She is alert and oriented to person, place, and time. She has normal reflexes.  Skin: Skin is warm and dry.  Psychiatric: She has a normal mood and affect. Her behavior is normal. Judgment and thought content normal.    Prenatal labs: ABO, Rh: A/Positive/-- (06/18 0000) Antibody: Negative (06/18 0000) Rubella: Immune (06/18 0000) RPR: Nonreactive (06/18 0000)  HBsAg: Negative (06/18 0000)  HIV:   neg GBS:   neg Declined genetic testing Early 1hr gtt elevated, 3hr gtt, normal Repeat 3hr gtt at 29wks was normal, FBS and 2hr PP were also normal   Assessment/Plan: IUP at [redacted]w[redacted]d Active labor GBS neg FHR reassuring Desires TOLAC - rv'd R/B of TOLAC, pt understands and desires to proceed, signed consent in office Desires waterbirth, - attended class, signed consent BP's had been slightly elevated, will CTO closely  Admit to birthing suites - Dr Pennie Rushing attending Routine CNM Orders PIH labs Will consider 24hour urine if BP remains elevated Ok for waterbirth if BP normal  D/W Dr Pennie Rushing    Malissa Hippo 12/30/2011, 7:09 PM

## 2011-12-30 NOTE — Anesthesia Preprocedure Evaluation (Deleted)
Anesthesia Evaluation    Airway Mallampati: III      Dental   Pulmonary asthma ,          Cardiovascular     Neuro/Psych    GI/Hepatic   Endo/Other  Morbid obesity  Renal/GU      Musculoskeletal   Abdominal   Peds  Hematology   Anesthesia Other Findings   Reproductive/Obstetrics                           Anesthesia Physical Anesthesia Plan  ASA: III  Anesthesia Plan:    Post-op Pain Management:    Induction:   Airway Management Planned:   Additional Equipment:   Intra-op Plan:   Post-operative Plan:   Informed Consent:   Plan Discussed with:   Anesthesia Plan Comments:         Anesthesia Quick Evaluation

## 2011-12-30 NOTE — Progress Notes (Signed)
Pt reports uc's irregualr uc's and LOF since 0100. Pt feels + fm in MAU.

## 2011-12-30 NOTE — Progress Notes (Signed)
Contractions all day worse this morning, decreased fetal movement, light spotting, unsure if water broke. G2P1 39 weeks previous C/S VBAC this pregnancy.

## 2011-12-30 NOTE — Progress Notes (Signed)
Tub water temp: 98.0 F

## 2011-12-30 NOTE — Progress Notes (Signed)
Subjective: Pt in tub since around 2300. Spontaneous ctxs q 3-5 minutes.  Coping well; husband and mother and doula at bedside and supportive.    Objective: BP 129/65  Pulse 134  Temp(Src) 98.2 F (36.8 C) (Oral)  Resp 20  SpO2 99%      FHT:  FHR: 135 bpm, variability: moderate,  accelerations:  Present,  decelerations:  Absent UC:   regular, every 3-5 minutes SVE:   Dilation: 7.5 Effacement (%): 100 Station: 0 Exam by:: s. lillard, cnm  Labs: Lab Results  Component Value Date   WBC 13.0* 12/30/2011   HGB 13.4 12/30/2011   HCT 41.0 12/30/2011   MCV 85.1 12/30/2011   PLT 251 12/30/2011    Assessment / Plan: 1. IUP at 39.6  2.  Transition  3.  TOLAC  4.  Desires Waterbirth  5.  GBS negative  Labor: Progressing normally Preeclampsia:  no signs or symptoms of toxicity Fetal Wellbeing:  Category I Pain Control:  Labor support without medications I/D:  n/a Anticipated MOD:  H20birth AROM prn augmentation. C/w MD prn. Makya Phillis H 12/30/2011, 11:56 PM

## 2011-12-30 NOTE — ED Provider Notes (Signed)
History     Chief Complaint  Patient presents with  . Contractions  . Decreased Fetal Movement   HPI Comments: Pt is a G3P0111 at [redacted]w[redacted]d, that arrives with c/o leaking fluid since about 0100 and irregular ctx, after doing FKC, only felt 1 movement from about 1530-1330. Denies VB.  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 Elevated BMI Hx anxiety Hypothyroidism Hx PCOS Asthma GBS negative      Past Medical History  Diagnosis Date  . Asthma   . Polycystic ovarian syndrome   . Anxiety   . Hypothyroidism   . Cystitis     Past Surgical History  Procedure Date  . Cesarean section   . Cyst removed from wrist   . Cholecystectomy   . Tonsillectomy and adenoidectomy   . Eardrum repair   . Wisdom tooth extraction     Family History  Problem Relation Age of Onset  . Anesthesia problems Neg Hx   . Arthritis Father   . Hypertension Maternal Grandmother   . Diabetes Maternal Grandmother   . Diabetes Paternal Grandmother   . Hypertension Paternal Grandmother   . Heart disease Paternal Grandmother   . Cancer Paternal Grandfather     History  Substance Use Topics  . Smoking status: Never Smoker   . Smokeless tobacco: Never Used  . Alcohol Use: No    Allergies: No Known Allergies  Prescriptions prior to admission  Medication Sig Dispense Refill  . Cholecalciferol (VITAMIN D3) 400 UNITS CAPS Take 1 capsule by mouth daily.        . Docosahexaenoic Acid (DHA COMPLETE PO) Take 1 capsule by mouth daily.        Marland Kitchen levothyroxine (SYNTHROID, LEVOTHROID) 125 MCG tablet Take 125 mcg by mouth daily. On Saturdays and Sundays take 1 and 1/2 tabs (187.32mcg)       . Prenatal Vit-Fe Fumarate-FA (PRENATAL MULTIVITAMIN) TABS Take 1 tablet by mouth daily.        Review of Systems  All other systems reviewed and are negative.   Physical Exam   Blood pressure 146/86,  pulse 91, temperature 99.3 F (37.4 C), temperature source Oral, resp. rate 16, height 5\' 3"  (1.6 m), weight 138.529 kg (305 lb 6.4 oz).  Filed Vitals:   12/30/11 1511 12/30/11 1539 12/30/11 1557 12/30/11 1607  BP: 149/74 149/101 141/86 146/86  Pulse: 104 96 89 91  Temp: 97.8 F (36.6 C)   99.3 F (37.4 C)  TempSrc: Oral     Resp: 16     Height: 5\' 3"  (1.6 m)     Weight: 138.529 kg (305 lb 6.4 oz)        Physical Exam  Nursing note and vitals reviewed. Constitutional: She is oriented to person, place, and time. She appears well-developed and well-nourished. No distress.       Has breathed through some ctx  HENT:  Head: Normocephalic.  Neck: Normal range of motion.  Cardiovascular: Normal rate.   Respiratory: Effort normal.  GI: Soft.  Genitourinary: Vagina normal. No vaginal discharge found.       amnisure neg VE=4/75/-1 intact, vtx  Musculoskeletal: Normal range of motion. She exhibits edema.       2+pedal/ankle  Neurological: She is alert and oriented to person, place, and time. She has normal reflexes.  Skin: Skin is warm and dry.  Psychiatric: She has a normal mood and affect. Her behavior is  normal.    MAU Course  Procedures    Assessment and Plan  IUP at [redacted]w[redacted]d Latent labor BP slightly elevated, PIH labs and UA were normal on 2/16 GBS neg amnisure neg FHR non-reactive at this time  Pt desires to return home, secondary to irregular/rare ctx Has f/u appointment Tuesday Will CTO FHR and BP's Reevaluate cervix and discuss options Plan to collect 24hour urine  Shade Rivenbark M 12/30/2011, 4:48 PM   At 1705 BP normal FHR reactive, 130, mod variability, no decels toco irregular D/C home FKC and labor precautions 24hour urine to RTO on Tuesday at appointment  Pt declines to recheck cx

## 2011-12-30 NOTE — Progress Notes (Signed)
Pt in birthing pool.

## 2011-12-31 ENCOUNTER — Encounter (HOSPITAL_COMMUNITY): Payer: Self-pay

## 2011-12-31 DIAGNOSIS — O34219 Maternal care for unspecified type scar from previous cesarean delivery: Secondary | ICD-10-CM | POA: Diagnosis not present

## 2011-12-31 MED ORDER — TETANUS-DIPHTH-ACELL PERTUSSIS 5-2.5-18.5 LF-MCG/0.5 IM SUSP
0.5000 mL | Freq: Once | INTRAMUSCULAR | Status: AC
  Administered 2012-01-01: 0.5 mL via INTRAMUSCULAR
  Filled 2011-12-31: qty 0.5

## 2011-12-31 MED ORDER — BUTORPHANOL TARTRATE 2 MG/ML IJ SOLN
INTRAMUSCULAR | Status: AC
  Administered 2011-12-31: 1 mg via INTRAVENOUS
  Filled 2011-12-31: qty 1

## 2011-12-31 MED ORDER — ZOLPIDEM TARTRATE 5 MG PO TABS: 5.0000 mg | ORAL_TABLET | Freq: Every evening | ORAL | Status: DC | PRN

## 2011-12-31 MED ORDER — LANOLIN HYDROUS EX OINT: TOPICAL_OINTMENT | CUTANEOUS | Status: DC | PRN

## 2011-12-31 MED ORDER — DIBUCAINE 1 % RE OINT: 1.0000 "application " | TOPICAL_OINTMENT | RECTAL | Status: DC | PRN

## 2011-12-31 MED ORDER — IBUPROFEN 600 MG PO TABS
600.0000 mg | ORAL_TABLET | Freq: Four times a day (QID) | ORAL | Status: DC
  Administered 2011-12-31 – 2012-01-02 (×10): 600 mg via ORAL
  Filled 2011-12-31 (×5): qty 1
  Filled 2011-12-31: qty 2
  Filled 2011-12-31 (×3): qty 1

## 2011-12-31 MED ORDER — BENZOCAINE-MENTHOL 20-0.5 % EX AERO
INHALATION_SPRAY | CUTANEOUS | Status: AC
  Administered 2011-12-31: 1 via TOPICAL
  Filled 2011-12-31: qty 56

## 2011-12-31 MED ORDER — BUTORPHANOL TARTRATE 2 MG/ML IJ SOLN
1.0000 mg | INTRAMUSCULAR | Status: DC | PRN
  Administered 2011-12-31: 1 mg via INTRAVENOUS

## 2011-12-31 MED ORDER — SIMETHICONE 80 MG PO CHEW
80.0000 mg | CHEWABLE_TABLET | ORAL | Status: DC | PRN
  Administered 2012-01-01: 80 mg via ORAL

## 2011-12-31 MED ORDER — PRENATAL MULTIVITAMIN CH
1.0000 | ORAL_TABLET | Freq: Every day | ORAL | Status: DC
  Administered 2011-12-31: 1 via ORAL
  Filled 2011-12-31 (×2): qty 1

## 2011-12-31 MED ORDER — LEVOTHYROXINE SODIUM 125 MCG PO TABS
125.0000 ug | ORAL_TABLET | Freq: Every day | ORAL | Status: DC
  Administered 2011-12-31 – 2012-01-02 (×3): 125 ug via ORAL
  Filled 2011-12-31 (×5): qty 1

## 2011-12-31 MED ORDER — BENZOCAINE-MENTHOL 20-0.5 % EX AERO
1.0000 "application " | INHALATION_SPRAY | CUTANEOUS | Status: DC | PRN
  Administered 2011-12-31: 1 via TOPICAL

## 2011-12-31 MED ORDER — FENTANYL CITRATE 0.05 MG/ML IJ SOLN
INTRAMUSCULAR | Status: AC
Start: 2011-12-31 — End: 2011-12-31
  Administered 2011-12-31: 100 ug via INTRAVENOUS
  Filled 2011-12-31: qty 2

## 2011-12-31 MED ORDER — FENTANYL CITRATE 0.05 MG/ML IJ SOLN
100.0000 ug | INTRAMUSCULAR | Status: DC | PRN
  Administered 2011-12-31: 100 ug via INTRAVENOUS

## 2011-12-31 MED ORDER — ONDANSETRON HCL 4 MG/2ML IJ SOLN: 4.0000 mg | INTRAMUSCULAR | Status: DC | PRN

## 2011-12-31 MED ORDER — BUPIVACAINE HCL 0.25 % IJ SOLN
10.0000 mL | Freq: Once | INTRAMUSCULAR | Status: DC
  Filled 2011-12-31: qty 10

## 2011-12-31 MED ORDER — DIPHENHYDRAMINE HCL 25 MG PO CAPS: 25.0000 mg | ORAL_CAPSULE | Freq: Four times a day (QID) | ORAL | Status: DC | PRN

## 2011-12-31 MED ORDER — MAGNESIUM HYDROXIDE 400 MG/5ML PO SUSP: 30.0000 mL | ORAL | Status: DC | PRN

## 2011-12-31 MED ORDER — BUPIVACAINE HCL 0.25 % IJ SOLN
30.0000 mL | Freq: Once | INTRAMUSCULAR | Status: AC
  Administered 2011-12-31: 30 mL

## 2011-12-31 MED ORDER — MEDROXYPROGESTERONE ACETATE 150 MG/ML IM SUSP: 150.0000 mg | INTRAMUSCULAR | Status: DC | PRN

## 2011-12-31 MED ORDER — ONDANSETRON HCL 4 MG PO TABS: 4.0000 mg | ORAL_TABLET | ORAL | Status: DC | PRN

## 2011-12-31 MED ORDER — OXYCODONE-ACETAMINOPHEN 5-325 MG PO TABS
1.0000 | ORAL_TABLET | ORAL | Status: DC | PRN
  Administered 2011-12-31 – 2012-01-02 (×10): 1 via ORAL
  Administered 2012-01-02: 2 via ORAL
  Administered 2012-01-02: 1 via ORAL
  Filled 2011-12-31 (×8): qty 1
  Filled 2011-12-31: qty 2
  Filled 2011-12-31 (×3): qty 1

## 2011-12-31 MED ORDER — SENNOSIDES-DOCUSATE SODIUM 8.6-50 MG PO TABS
2.0000 | ORAL_TABLET | Freq: Every day | ORAL | Status: DC
  Administered 2011-12-31 – 2012-01-01 (×2): 2 via ORAL

## 2011-12-31 MED ORDER — WITCH HAZEL-GLYCERIN EX PADS: 1.0000 "application " | MEDICATED_PAD | CUTANEOUS | Status: DC | PRN

## 2011-12-31 NOTE — Progress Notes (Signed)
Post Partum Day 0:  Delivery at 1:07am. Subjective: Very tired and sore.  Hasn't ambulated much yet.  Husband at bedside.  Taking pain medication.  Objective: Blood pressure 129/84, pulse 100, temperature 98.9 F (37.2 C), temperature source Oral, resp. rate 18, SpO2 98.00%, unknown if currently breastfeeding.  Filed Vitals:   12/31/11 0348 12/31/11 0419 12/31/11 0445 12/31/11 0545  BP: 158/72 142/77 142/91 129/84  Pulse: 126 102 101 100  Temp:  98.9 F (37.2 C) 98.4 F (36.9 C) 98.9 F (37.2 C)  TempSrc:  Oral Oral Oral  Resp: 20 20 18 18   SpO2:        Physical Exam:  General: alert and fatigued Lochia: appropriate Uterine Fundus: firm Incision: healing well DVT Evaluation: No evidence of DVT seen on physical exam. Negative Homan's sign.   Basename 12/30/11 2000 12/29/11 0030  HGB 13.4 13.1  HCT 41.0 39.6   Results for orders placed during the hospital encounter of 12/30/11 (from the past 24 hour(s))  CBC     Status: Abnormal   Collection Time   12/30/11  8:00 PM      Component Value Range   WBC 13.0 (*) 4.0 - 10.5 (K/uL)   RBC 4.82  3.87 - 5.11 (MIL/uL)   Hemoglobin 13.4  12.0 - 15.0 (g/dL)   HCT 91.4  78.2 - 95.6 (%)   MCV 85.1  78.0 - 100.0 (fL)   MCH 27.8  26.0 - 34.0 (pg)   MCHC 32.7  30.0 - 36.0 (g/dL)   RDW 21.3 (*) 08.6 - 15.5 (%)   Platelets 251  150 - 400 (K/uL)  RPR     Status: Normal   Collection Time   12/30/11  8:00 PM      Component Value Range   RPR NON REACTIVE  NON REACTIVE   COMPREHENSIVE METABOLIC PANEL     Status: Abnormal   Collection Time   12/30/11  8:00 PM      Component Value Range   Sodium 136  135 - 145 (mEq/L)   Potassium 4.2  3.5 - 5.1 (mEq/L)   Chloride 102  96 - 112 (mEq/L)   CO2 23  19 - 32 (mEq/L)   Glucose, Bld 97  70 - 99 (mg/dL)   BUN 4 (*) 6 - 23 (mg/dL)   Creatinine, Ser 5.78  0.50 - 1.10 (mg/dL)   Calcium 9.4  8.4 - 46.9 (mg/dL)   Total Protein 6.3  6.0 - 8.3 (g/dL)   Albumin 2.7 (*) 3.5 - 5.2 (g/dL)   AST 23  0  - 37 (U/L)   ALT 14  0 - 35 (U/L)   Alkaline Phosphatase 124 (*) 39 - 117 (U/L)   Total Bilirubin 0.3  0.3 - 1.2 (mg/dL)   GFR calc non Af Amer >90  >90 (mL/min)   GFR calc Af Amer >90  >90 (mL/min)  LACTATE DEHYDROGENASE     Status: Normal   Collection Time   12/30/11  8:00 PM      Component Value Range   LD 245  94 - 250 (U/L)  URIC ACID     Status: Normal   Collection Time   12/30/11  8:00 PM      Component Value Range   Uric Acid, Serum 5.6  2.4 - 7.0 (mg/dL)     Assessment/Plan: Continue to observe BP. Encourage ambulation/mobility. Patient anticipates full 2-day stay.   LOS: 1 day   Lawrie Tunks 12/31/2011, 8:06 AM

## 2011-12-31 NOTE — Progress Notes (Signed)
UR chart review completed.  

## 2011-12-31 NOTE — Progress Notes (Signed)
Birthing pool temp: 98.1 F

## 2011-12-31 NOTE — Op Note (Signed)
Called to evaluate patient after delivery.  Third degree laceration noted with right lateral sulcus tear.   Anesthesia: Mixture of 2% xylocaine and o.25% Marcaine in a 1:1 ratio.  Total  30 cc used Procedure:  Sphincter muscle repaired with 3 interrupted sutures of 2-0 Vicryl Plus.  Vaginal mucosa repaired with combination of running and interrupted sutures of 3-0 Vicryl plus.  Perineum repaired in layered fashion with 3-0 Vicryl plus. Hemostasis adequate.

## 2012-01-01 LAB — LACTATE DEHYDROGENASE: LDH: 166 U/L (ref 94–250)

## 2012-01-01 LAB — CBC
HCT: 33.3 % — ABNORMAL LOW (ref 36.0–46.0)
Hemoglobin: 10.5 g/dL — ABNORMAL LOW (ref 12.0–15.0)
MCH: 27.7 pg (ref 26.0–34.0)
MCHC: 31.5 g/dL (ref 30.0–36.0)

## 2012-01-01 MED ORDER — CYCLOBENZAPRINE HCL 10 MG PO TABS
10.0000 mg | ORAL_TABLET | Freq: Three times a day (TID) | ORAL | Status: DC | PRN
  Filled 2012-01-01: qty 1

## 2012-01-01 NOTE — Progress Notes (Signed)
Post Partum Day 1 Subjective: voiding, tolerating PO, + flatus and no BM post-delivery.  c/o Rt flank pain when up OOB, and noticed more when standing in shower and when ambulating in hall.  No fever, chills, or dysuria.  Pt has been putting warm rice pack on area w/ some relief, but notes doesn't really bother her when at rest.  BF'ng well thus far.  VB lighter.  No dizziness when up.  Pt had neg u/a on admission.    Objective: Blood pressure 107/73, pulse 91, temperature 98.5 F (36.9 C), temperature source Oral, resp. rate 18, SpO2 98.00%, unknown if currently breastfeeding.  Physical Exam:  General: alert, cooperative, mild distress and moderately obese Lochia: appropriate, rubra Uterine Fundus: firm, below umbilicus Incision: n/a No CVAT DVT Evaluation: No evidence of DVT seen on physical exam. Negative Homan's sign. Calf/Ankle edema is present. 2-3+ BLE pitting edema   Basename 01/01/12 0618 12/30/11 2000  HGB 10.5* 13.4  HCT 33.3* 41.0    Assessment/Plan: Plan for discharge tomorrow and Breastfeeding  Rt flank pain--unknown origin.  S/p H2Obirth w/ 3rd degree. VBAC.   Per c/w Dr. Normand Sloop, MD rec'd cath u/a; also ordered Flexeril tid prn flank pain. Continue motrin and heat and percocet prn.    LOS: 2 days   Audrey Greer 01/01/2012, 12:27 PM

## 2012-01-01 NOTE — Progress Notes (Signed)
Pt's RN called to report pt has refused cath u/a.  Will d/c order.

## 2012-01-02 MED ORDER — OXYCODONE-ACETAMINOPHEN 5-325 MG PO TABS: 1.0000 | ORAL_TABLET | ORAL | Status: AC | PRN

## 2012-01-02 MED ORDER — IBUPROFEN 600 MG PO TABS: 600.0000 mg | ORAL_TABLET | Freq: Four times a day (QID) | ORAL | Status: AC

## 2012-01-02 NOTE — Discharge Summary (Signed)
Physician Discharge Summary  Patient ID: Audrey Greer MRN: [id number] DOB/AGE: 26/27/1987 26 y.o.  Admit date: 12/30/2011 Discharge date: 01/02/2012  Admission Diagnoses:term pg VBAC  Discharge Diagnoses:  Principal Problem:  *VBAC, delivered, current hospitalization Active Problems:  Desires VBAC (vaginal birth after cesarean) trial  Third degree perineal laceration, delivered, current hospitalization lower r sided and midline back pain  Discharged Condition: stable  Hospital Course: active labor, ARM clear, water birth, 3 degree laceration and R sulcus laceration  Consults: None  Significant Diagnostic Studies: PIH labs WNL no 24 hour urine  Treatments:  Subjective: Denies ha, visual spots or blurring, no swelling hands, face, ankles, back pain low and some pain to lower R side less than yesterday, no burning with urination, breastfeeding well, little bleeding, plans natural family planning Discharge Exam: Blood pressure 118/76, pulse 90, temperature 98.3 F (36.8 C), temperature source Oral, resp. rate 18, SpO2 98.00%, unknown if currently breastfeeding. General appearance: alert, cooperative and no distress breasts filling, abd soft, rounded nt, FF sm serosa flow, perineum well approximated no redness, edema, drainage or ecchymosis, DTRS +1 bilaterally, +1 edema lower legs, negative CVAT bilaterally.  Disposition: 01-Home or Self Care  Discharge Orders    Future Orders Please Complete By Expires   Strep B DNA probe      Comments:   This external order was created through the Results Console.   HIV antibody      Comments:   This external order was created through the Results Console.   GC/chlamydia probe amp, genital      Comments:   This external order was created through the Results Console.     Medication List  As of 01/02/2012  8:30 AM   ASK your doctor about these medications         DHA COMPLETE PO   Take 1 capsule by mouth daily.      levothyroxine 125 MCG  tablet   Commonly known as: SYNTHROID, LEVOTHROID   Take 125 mcg by mouth daily. On Saturdays and Sundays take 1 and 1/2 tabs (187.54mcg)      prenatal multivitamin Tabs   Take 1 tablet by mouth daily.      Vitamin D3 400 UNITS Caps   Take 1 capsule by mouth daily.          reviewed s/s pp and PIH to report and shortness of breath, smart start RN to see 1 week bp check. Discussed heat, ice to lower back, report if back pain unresolved in a few days or if worsens. [facility] handout. Notify endocrine regarding thyroid dosing postpartum. Collaboration with [facility] Stefano Gaul at [facility].   SignedChiquita Greer, Audrey Greer 01/02/2012, 8:30 AM

## 2012-01-02 NOTE — Discharge Instructions (Signed)
Hypertension As your heart beats, it forces blood through your arteries. This force is your blood pressure. If the pressure is too high, it is called hypertension (HTN) or high blood pressure. HTN is dangerous because you may have it and not know it. High blood pressure may mean that your heart has to work harder to pump blood. Your arteries may be narrow or stiff. The extra work puts you at risk for heart disease, stroke, and other problems.  Blood pressure consists of two numbers, a higher number over a lower, 110/72, for example. It is stated as "110 over 72." The ideal is below 120 for the top number (systolic) and under 80 for the bottom (diastolic). Write down your blood pressure today. You should pay close attention to your blood pressure if you have certain conditions such as:  Heart failure.   Prior heart attack.   Diabetes   Chronic kidney disease.   Prior stroke.   Multiple risk factors for heart disease.  To see if you have HTN, your blood pressure should be measured while you are seated with your arm held at the level of the heart. It should be measured at least twice. A one-time elevated blood pressure reading (especially in the Emergency Department) does not mean that you need treatment. There may be conditions in which the blood pressure is different between your right and left arms. It is important to see your caregiver soon for a recheck. Most people have essential hypertension which means that there is not a specific cause. This type of high blood pressure may be lowered by changing lifestyle factors such as:  Stress.   Smoking.   Lack of exercise.   Excessive weight.   Drug/tobacco/alcohol use.   Eating less salt.  Most people do not have symptoms from high blood pressure until it has caused damage to the body. Effective treatment can often prevent, delay or reduce that damage. TREATMENT  When a cause has been identified, treatment for high blood pressure is  directed at the cause. There are a large number of medications to treat HTN. These fall into several categories, and your caregiver will help you select the medicines that are best for you. Medications may have side effects. You should review side effects with your caregiver. If your blood pressure stays high after you have made lifestyle changes or started on medicines,   Your medication(s) may need to be changed.   Other problems may need to be addressed.   Be certain you understand your prescriptions, and know how and when to take your medicine.   Be sure to follow up with your caregiver within the time frame advised (usually within two weeks) to have your blood pressure rechecked and to review your medications.   If you are taking more than one medicine to lower your blood pressure, make sure you know how and at what times they should be taken. Taking two medicines at the same time can result in blood pressure that is too low.  SEEK IMMEDIATE MEDICAL CARE IF:  You develop a severe headache, blurred or changing vision, or confusion.   You have unusual weakness or numbness, or a faint feeling.   You have severe chest or abdominal pain, vomiting, or breathing problems.  MAKE SURE YOU:   Understand these instructions.   Will watch your condition.   Will get help right away if you are not doing well or get worse.  Document Released: 10/29/2005 Document Revised: 07/11/2011 Document Reviewed:   06/18/2008 [employer] Patient Information 2012 [place], [place]. Postpartum Care After Vaginal Delivery After you deliver your baby, you will stay in the hospital for 24 to 72 hours, unless there were problems with the labor or delivery, or you have medical problems. While you are in the hospital, you will receive help and instructions on how to care for yourself and your baby. Your doctor will order pain medicine, in case you need it. You will have a small amount of bleeding from your vagina and should  change your sanitary pad frequently. Wash your hands thoroughly with soap and water for at least 20 seconds after changing pads and using the toilet. Let the nurses know if you begin to pass blood clots or your bleeding increases. Do not flush blood clots down the toilet before having the nurse look at them, to make sure there is no placental tissue with them. If you had an intravenous (IV), it will be removed within 24 hours, if there are no problems. The first time you get out of bed or take a shower, call the nurse to help you because you may get weak, lightheaded, or even faint. If you are breastfeeding, you may feel painful contractions of your uterus for a couple of weeks. This is normal. The contractions help your uterus get back to normal size. If you are not breastfeeding, wear a supportive bra and handle your breasts as little as possible until your milk has dried up. Hormones should not be given to dry up the breasts, because they can cause blood clots. You will be given your normal diet, unless you have diabetes or other medical problems.  The nurses may put an ice pack on your episiotomy (surgically enlarged opening), if you have one, to reduce the pain and swelling. On rare occasions, you may not be able to urinate and the nurse will need to empty your bladder with a catheter. If you had a postpartum tubal ligation ("tying tubes," female sterilization), it should not make your stay in the hospital longer. You may have your baby in your room with you as much as you like, unless you or the baby has a problem. Use the bassinet (basket) for the baby when going to and from the nursery. Do not carry the baby. Do not leave the postpartum area. If the mother is Rh negative (lacks a protein on the red blood cells) and the baby is Rh positive, the mother should get a Rho-gam shot to prevent Rh problems with future pregnancies. You may be given written instructions for you and your baby, and necessary  medicines, when you are discharged from the hospital. Be sure you understand and follow the instructions as advised. HOME CARE INSTRUCTIONS   Follow instructions and take the medicines given to you.   Only take over-the-counter or prescription medicines for pain, discomfort, or fever as directed by your caregiver.   Do not take aspirin, because it can cause bleeding.   Increase your activities a little bit every day to build up your strength and endurance.   Do not drink alcohol, especially if you are breastfeeding or taking pain medicine.   Take your temperature twice a day and record it.   You may have a small amount of bleeding or spotting for 2 to 4 weeks. This is normal.   Do not use tampons or douche. Use sanitary pads.   Try to have someone stay and help you for a few days when you go home.  Try to rest or take a nap when the baby is sleeping.   If you are breastfeeding, wear a good support bra. If you are not breastfeeding, wear a supportive bra and do not stimulate your nipples.   Eat a healthy, nutritious diet and continue to take your prenatal vitamins.   Do not drive, do any heavy activities, or travel until your caregiver tells you it is okay.   Do not have intercourse until your caregiver gives you permission to do so.   Ask your caregiver when you can begin to exercise and what type of exercises to do.   Call your caregiver if you think you are having a problem from your delivery.   Call your pediatrician if you are having a problem with the baby.   Schedule your postpartum visit and keep it.  SEEK MEDICAL CARE IF:   You have a temperature of 100 F (37.8 C) or higher.   You have increased vaginal bleeding or are passing clots. Save any clots to show your caregiver.   You have bloody urine or pain when you urinate.   You have a bad smelling vaginal discharge.   You have increasing pain or swelling on your episiotomy.   You develop a severe headache.     You feel depressed.   The episiotomy is separating.   You become dizzy or lightheaded.   You develop a rash.   You have a reaction or problems with your medicine.   You have pain, redness, or swelling at the intravenous site.  SEEK IMMEDIATE MEDICAL CARE IF:   You have chest pain.   You develop shortness of breath.   You pass out.   You develop pain, with or without swelling or redness in your leg.   You develop heavy vaginal bleeding, with or without blood clots.   You develop stomach pain.   You develop a bad smelling vaginal discharge.  MAKE SURE YOU:   Understand these instructions.   Will watch your condition.   Will get help right away if you are not doing well or get worse.  Document Released: 08/26/2007 Document Revised: 07/11/2011 Document Reviewed: 09/07/2009 [facility] Patient Information 2012 [place], [place].Breastfeeding BENEFITS OF BREASTFEEDING For the baby  The first milk (colostrum) helps the baby's digestive system function better.   There are antibodies from the mother in the milk that help the baby fight off infections.   The baby has a lower incidence of asthma, allergies, and SIDS (sudden infant death syndrome).   The nutrients in breast milk are better than formulas for the baby and helps the baby's brain grow better.   Babies who breastfeed have less gas, colic, and constipation.  For the mother  Breastfeeding helps develop a very special bond between mother and baby.   It is more convenient, always available at the correct temperature and cheaper than formula feeding.   It burns calories in the mother and helps with losing weight that was gained during pregnancy.   It makes the uterus contract back down to normal size faster and slows bleeding following delivery.   Breastfeeding mothers have a lower risk of developing breast cancer.  NURSE FREQUENTLY  A healthy, full-term baby may breastfeed as often as every hour or space his or  her feedings to every 3 hours.   How often to nurse will vary from baby to baby. Watch your baby for signs of hunger, not the clock.   Nurse as often as the baby  requests, or when you feel the need to reduce the fullness of your breasts.   Awaken the baby if it has been 3 to 4 hours since the last feeding.   Frequent feeding will help the mother make more milk and will prevent problems like sore nipples and engorgement of the breasts.  BABY'S POSITION AT THE BREAST  Whether lying down or sitting, be sure that the baby's tummy is facing your tummy.   Support the breast with 4 fingers underneath the breast and the thumb above. Make sure your fingers are well away from the nipple and baby's mouth.   Stroke the baby's lips and cheek closest to the breast gently with your finger or nipple.   When the baby's mouth is open wide enough, place all of your nipple and as much of the dark area around the nipple as possible into your baby's mouth.   Pull the baby in close so the tip of the nose and the baby's cheeks touch the breast during the feeding.  FEEDINGS  The length of each feeding varies from baby to baby and from feeding to feeding.   The baby must suck about 2 to 3 minutes for your milk to get to him or her. This is called a "let down." For this reason, allow the baby to feed on each breast as long as he or she wants. Your baby will end the feeding when he or she has received the right balance of nutrients.   To break the suction, put your finger into the corner of the baby's mouth and slide it between his or her gums before removing your breast from his or her mouth. This will help prevent sore nipples.  REDUCING BREAST ENGORGEMENT  In the first week after your baby is born, you may experience signs of breast engorgement. When breasts are engorged, they feel heavy, warm, full, and may be tender to the touch. You can reduce engorgement if you:   Nurse frequently, every 2 to 3 hours.  Mothers who breastfeed early and often have fewer problems with engorgement.   Place light ice packs on your breasts between feedings. This reduces swelling. Wrap the ice packs in a lightweight towel to protect your skin.   Apply moist hot packs to your breast for 5 to 10 minutes before each feeding. This increases circulation and helps the milk flow.   Gently massage your breast before and during the feeding.   Make sure that the baby empties at least one breast at every feeding before switching sides.   Use a breast pump to empty the breasts if your baby is sleepy or not nursing well. You may also want to pump if you are returning to work or or you feel you are getting engorged.   Avoid bottle feeds, pacifiers or supplemental feedings of water or juice in place of breastfeeding.   Be sure the baby is latched on and positioned properly while breastfeeding.   Prevent fatigue, stress, and anemia.   Wear a supportive bra, avoiding underwire styles.   Eat a balanced diet with enough fluids.  If you follow these suggestions, your engorgement should improve in 24 to 48 hours. If you are still experiencing difficulty, call your lactation consultant or caregiver. IS MY BABY GETTING ENOUGH MILK? Sometimes, mothers worry about whether their babies are getting enough milk. You can be assured that your baby is getting enough milk if:  The baby is actively sucking and you hear swallowing.  The baby nurses at least 8 to 12 times in a 24 hour time period. Nurse your baby until he or she unlatches or falls asleep at the first breast (at least 10 to 20 minutes), then offer the second side.   The baby is wetting 5 to 6 disposable diapers (6 to 8 cloth diapers) in a 24 hour period by 51 to 73 days of age.   The baby is having at least 2 to 3 stools every 24 hours for the first few months. Breast milk is all the food your baby needs. It is not necessary for your baby to have water or formula. In fact, to  help your breasts make more milk, it is best not to give your baby supplemental feedings during the early weeks.   The stool should be soft and yellow.   The baby should gain 4 to 7 ounces per week after he is 55 days old.  TAKE CARE OF YOURSELF Take care of your breasts by:  Bathing or showering daily.   Avoiding the use of soaps on your nipples.   Start feedings on your left breast at one feeding and on your right breast at the next feeding.   You will notice an increase in your milk supply 2 to 5 days after delivery. You may feel some discomfort from engorgement, which makes your breasts very firm and often tender. Engorgement "peaks" out within 24 to 48 hours. In the meantime, apply warm moist towels to your breasts for 5 to 10 minutes before feeding. Gentle massage and expression of some milk before feeding will soften your breasts, making it easier for your baby to latch on. Wear a well fitting nursing bra and air dry your nipples for 10 to 15 minutes after each feeding.   Only use cotton bra pads.   Only use pure lanolin on your nipples after nursing. You do not need to wash it off before nursing.  Take care of yourself by:   Eating well-balanced meals and nutritious snacks.   Drinking milk, fruit juice, and water to satisfy your thirst (about 8 glasses a day).   Getting plenty of rest.   Increasing calcium in your diet (1200 mg a day).   Avoiding foods that you notice affect the baby in a bad way.  SEEK MEDICAL CARE IF:   You have any questions or difficulty with breastfeeding.   You need help.   You have a hard, red, sore area on your breast, accompanied by a fever of 100.5 F (38.1 C) or more.   Your baby is too sleepy to eat well or is having trouble sleeping.   Your baby is wetting less than 6 diapers per day, by 16 days of age.   Your baby's skin or white part of his or her eyes is more yellow than it was in the hospital.   You feel depressed.  Document  Released: 10/29/2005 Document Revised: 07/11/2011 Document Reviewed: 06/13/2009 [facility] Patient Information 2012 [place], [place].

## 2012-03-19 ENCOUNTER — Ambulatory Visit (INDEPENDENT_AMBULATORY_CARE_PROVIDER_SITE_OTHER): Payer: Medicaid Other

## 2012-03-19 NOTE — Progress Notes (Signed)
Date of delivery: 12/31/11 Female Name: Audrey Greer Vaginal delivery:yes Cesarean section:no Tubal ligation:no GDM:no Breast Feeding:yes Bottle Feeding:no Post-Partum Blues:no Abnormal pap:no Normal GU function: yes Normal GI function:yes Returning to work:no Pt does not desire BC at this time. EPDS 7

## 2012-03-19 NOTE — Progress Notes (Signed)
Subjective:     Audrey Greer is a 26 y.o. female who presents for a postpartum visit.  I have fully reviewed the prenatal and intrapartum course. Pt reports 1 menstrual cycle since delivery.  Milk supply enough for son, and donating some to parents who had to use a gestational carrier.  Doing well.  Will see her endocrinologist in [place] for check-up and will check thyroid labs then and decide whether to resume Metformin.  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  Good support at home since delivery.  Perineum started feeling much better after about 9-10 weeks.  Denies s/s of PPD or mastitis, thrush, or yeast.  Pleased w/ VBAC but b/c such significant pain in transition and repair, pt feels "didn't handle well."  Reassured her she did great coping with labor and delivery and repair.  Unsure on birth spacing.  Daughter starts kindergarten this fall.     Patient is not sexually active.   The following portions of the patient's history were reviewed and updated as appropriate: allergies, current medications, past family history, past medical history, past social history, past surgical history and problem list.  Review of Systems Pertinent items are noted in HPI.   Objective:  EPPS=7   BP 102/66  Resp 14  Wt 266 lb (120.657 kg)  LMP 02/27/2012  Breastfeeding? Yes  General:  alert, cooperative and no distress; tearful only when recalling pain in transition, at delivery and during repair.           Abdomen: soft, non-tender; bowel sounds normal; no masses,  no organomegaly; no significant diastasis   Vulva:  normal  Vagina: normal vagina; repair healed well; kegel 4/5     Corpus: normal size, contour, position, consistency, mobility, non-tender  Adnexa:  normal adnexa   Nml uterine involution          Assessment:     Normal 11week postpartum exam; successful VBAC and H20birth S/p 3rd degree  lac. Hypothyroidism. PCOS Obese Stable asthma Pap smear not done at today's visit.   Plan:     1. Contraception: condoms; nat family planning 2.  F/u 5/18 w/ endocrinologist or prn hypothyroidism, PCOS. 3. Follow up in: August 2013 per pt request for AEx/Pap, or as needed. 4.  Rec'd healthy lifestyle, continued wt loss, daily vit, kegels, sunscreen.    Antonietta Breach CNM 03/19/2012 8:24 PM

## 2013-09-02 ENCOUNTER — Encounter: Payer: Self-pay | Admitting: Advanced Practice Midwife

## 2014-05-27 DIAGNOSIS — Z8742 Personal history of other diseases of the female genital tract: Secondary | ICD-10-CM | POA: Insufficient documentation

## 2014-11-24 ENCOUNTER — Emergency Department (INDEPENDENT_AMBULATORY_CARE_PROVIDER_SITE_OTHER)
Admission: EM | Admit: 2014-11-24 | Discharge: 2014-11-24 | Disposition: A | Discharge status: Home / Self Care | Payer: Managed Care, Other (non HMO) | Source: Home / Self Care | Attending: Family Medicine | Admitting: Family Medicine

## 2014-11-24 ENCOUNTER — Emergency Department
Admission: EM | Admit: 2014-11-24 | Discharge: 2014-11-24 | Disposition: A | Discharge status: Home / Self Care | Payer: Managed Care, Other (non HMO) | Source: Home / Self Care

## 2014-11-24 ENCOUNTER — Encounter: Payer: Self-pay | Admitting: *Deleted

## 2014-11-24 ENCOUNTER — Emergency Department (INDEPENDENT_AMBULATORY_CARE_PROVIDER_SITE_OTHER): Payer: Managed Care, Other (non HMO)

## 2014-11-24 DIAGNOSIS — N8320 Unspecified ovarian cysts: Secondary | ICD-10-CM

## 2014-11-24 DIAGNOSIS — K760 Fatty (change of) liver, not elsewhere classified: Secondary | ICD-10-CM

## 2014-11-24 DIAGNOSIS — N83201 Unspecified ovarian cyst, right side: Secondary | ICD-10-CM

## 2014-11-24 DIAGNOSIS — R1031 Right lower quadrant pain: Secondary | ICD-10-CM

## 2014-11-24 LAB — POCT URINALYSIS DIP (MANUAL ENTRY)
BILIRUBIN UA: NEGATIVE
Blood, UA: NEGATIVE
GLUCOSE UA: NEGATIVE
Ketones, POC UA: NEGATIVE
Nitrite, UA: NEGATIVE
PROTEIN UA: NEGATIVE
SPEC GRAV UA: 1.015 (ref 1.005–1.03)
Urobilinogen, UA: 0.2 (ref 0–1)
pH, UA: 7 (ref 5–8)

## 2014-11-24 LAB — POCT CBC W AUTO DIFF (K'VILLE URGENT CARE)

## 2014-11-24 LAB — POCT URINE PREGNANCY: PREG TEST UR: NEGATIVE

## 2014-11-24 MED ORDER — IOHEXOL 300 MG/ML  SOLN
100.0000 mL | Freq: Once | INTRAMUSCULAR | Status: AC | PRN
Start: 2014-11-24 — End: 2014-11-24
  Administered 2014-11-24: 100 mL via INTRAVENOUS

## 2014-11-24 MED ORDER — IOHEXOL 300 MG/ML  SOLN: 25.0000 mL | Freq: Once | INTRAMUSCULAR | Status: AC | PRN

## 2014-11-24 MED ORDER — HYDROCODONE-ACETAMINOPHEN 5-325 MG PO TABS: ORAL_TABLET | ORAL | Status: DC

## 2014-11-24 NOTE — Discharge Instructions (Signed)
May take Ibuprofen , 4 tabs every 8 hours with food.  If symptoms become significantly worse during the night or over the weekend, proceed to the local emergency room.    Ovarian Cyst An ovarian cyst is a fluid-filled sac that forms on an ovary. The ovaries are small organs that produce eggs in women. Various types of cysts can form on the ovaries. Most are not cancerous. Many do not cause problems, and they often go away on their own. Some may cause symptoms and require treatment. Common types of ovarian cysts include:  Functional cysts--These cysts may occur every month during the menstrual cycle. This is normal. The cysts usually go away with the next menstrual cycle if the woman does not get pregnant. Usually, there are no symptoms with a functional cyst.  Endometrioma cysts--These cysts form from the tissue that lines the uterus. They are also called "chocolate cysts" because they become filled with blood that turns brown. This type of cyst can cause pain in the lower abdomen during intercourse and with your menstrual period.  Cystadenoma cysts--This type develops from the cells on the outside of the ovary. These cysts can get very big and cause lower abdomen pain and pain with intercourse. This type of cyst can twist on itself, cut off its blood supply, and cause severe pain. It can also easily rupture and cause a lot of pain.  Dermoid cysts--This type of cyst is sometimes found in both ovaries. These cysts may contain different kinds of body tissue, such as skin, teeth, hair, or cartilage. They usually do not cause symptoms unless they get very big.  Theca lutein cysts--These cysts occur when too much of a certain hormone (human chorionic gonadotropin) is produced and overstimulates the ovaries to produce an egg. This is most common after procedures used to assist with the conception of a baby (in vitro fertilization). CAUSES   Fertility drugs can cause a condition in which multiple  large cysts are formed on the ovaries. This is called ovarian hyperstimulation syndrome.  A condition called polycystic ovary syndrome can cause hormonal imbalances that can lead to nonfunctional ovarian cysts. SIGNS AND SYMPTOMS  Many ovarian cysts do not cause symptoms. If symptoms are present, they may include:  Pelvic pain or pressure.  Pain in the lower abdomen.  Pain during sexual intercourse.  Increasing girth (swelling) of the abdomen.  Abnormal menstrual periods.  Increasing pain with menstrual periods.  Stopping having menstrual periods without being pregnant. DIAGNOSIS  These cysts are commonly found during a routine or annual pelvic exam. Tests may be ordered to find out more about the cyst. These tests may include:  Ultrasound.  X-ray of the pelvis.  CT scan.  MRI.  Blood tests. TREATMENT  Many ovarian cysts go away on their own without treatment. Your health care provider may want to check your cyst regularly for 2-3 months to see if it changes. For women in menopause, it is particularly important to monitor a cyst closely because of the higher rate of ovarian cancer in menopausal women. When treatment is needed, it may include any of the following:  A procedure to drain the cyst (aspiration). This may be done using a long needle and ultrasound. It can also be done through a laparoscopic procedure. This involves using a thin, lighted tube with a tiny camera on the end (laparoscope) inserted through a small incision.  Surgery to remove the whole cyst. This may be done using laparoscopic surgery or an open surgery  involving a larger incision in the lower abdomen.  Hormone treatment or birth control pills. These methods are sometimes used to help dissolve a cyst. HOME CARE INSTRUCTIONS   Only take over-the-counter or prescription medicines as directed by your health care provider.  Follow up with your health care provider as directed.  Get regular pelvic exams  and Pap tests. SEEK MEDICAL CARE IF:   Your periods are late, irregular, or painful, or they stop.  Your pelvic pain or abdominal pain does not go away.  Your abdomen becomes larger or swollen.  You have pressure on your bladder or trouble emptying your bladder completely.  You have pain during sexual intercourse.  You have feelings of fullness, pressure, or discomfort in your stomach.  You lose weight for no apparent reason.  You feel generally ill.  You become constipated.  You lose your appetite.  You develop acne.  You have an increase in body and facial hair.  You are gaining weight, without changing your exercise and eating habits.  You think you are pregnant. SEEK IMMEDIATE MEDICAL CARE IF:   You have increasing abdominal pain.  You feel sick to your stomach (nauseous), and you throw up (vomit).  You develop a fever that comes on suddenly.  You have abdominal pain during a bowel movement.  Your menstrual periods become heavier than usual. MAKE SURE YOU:  Understand these instructions.  Will watch your condition.  Will get help right away if you are not doing well or get worse. Document Released: 10/29/2005 Document Revised: 11/03/2013 Document Reviewed: 07/06/2013 [facility] Patient Information 2015 [employer] [place]. This information is not intended to replace advice given to you by your health care provider. Make sure you discuss any questions you have with your health care provider.

## 2014-11-24 NOTE — Discharge Instructions (Signed)
If Gyn exam is negative, return for further evaluation (if before 5pm).

## 2014-11-24 NOTE — ED Provider Notes (Addendum)
CSN: [id number]     Arrival date & time 11/24/14  1449 History   None    Chief complaint:  RLQ pain for 1.5 weeks, now worse.     HPI Comments: Patient reports that she developed a brief sudden lancinating pain in her right lower quadrant 1.5 weeks ago.  The pain has recurred briefly several times.  Yesterday the pain became constant and increased in intensity.  She describes the pain as constant, non-radiating, and stinging.  She has had mild nausea without vomiting.  She has a history of PCOS, and last menstrual period was August 2015. Past history includes cholecystectomy and C-section 8 years ago.  Patient is a 29 y.o. female presenting with abdominal pain. The history is provided by the patient.  Abdominal Pain Pain location:  RLQ Pain quality comment:  Stinging Pain radiates to:  Does not radiate Pain severity:  Moderate Onset quality:  Sudden Duration:  2 weeks Timing:  Constant Progression:  Worsening Chronicity:  New Context: awakening from sleep and previous surgery   Relieved by:  Nothing Ineffective treatments:  NSAIDs Associated symptoms: anorexia, fatigue and nausea   Associated symptoms: no chest pain, no chills, no constipation, no cough, no dysuria, no fever, no hematemesis, no hematochezia, no hematuria, no melena, no vaginal bleeding, no vaginal discharge and no vomiting   Risk factors: obesity     Past Medical History  Diagnosis Date  . Asthma   . Polycystic ovarian syndrome   . Anxiety   . Hypothyroidism   . Cystitis    Past Surgical History  Procedure Laterality Date  . Cesarean section    . Cyst removed from wrist    . Cholecystectomy    . Tonsillectomy and adenoidectomy    . Eardrum repair    . Wisdom tooth extraction     Family History  Problem Relation Age of Onset  . Anesthesia problems Neg Hx   . Arthritis Father   . Hypertension Father   . Diabetes Father   . Hypertension Maternal Grandmother   . Diabetes Maternal Grandmother   .  Diabetes Paternal Grandmother   . Hypertension Paternal Grandmother   . Heart disease Paternal Grandmother   . Cancer Paternal Grandfather   . Hypertension Mother    History  Substance Use Topics  . Smoking status: Never Smoker   . Smokeless tobacco: Never Used  . Alcohol Use: No   OB History    Gravida Para Term Preterm AB TAB SAB Ectopic Multiple Living   3 2 1 1 1  0 1 0 0 2     Review of Systems  Constitutional: Positive for fatigue. Negative for fever and chills.  Respiratory: Negative for cough.   Cardiovascular: Negative for chest pain.  Gastrointestinal: Positive for nausea, abdominal pain and anorexia. Negative for vomiting, constipation, melena, hematochezia and hematemesis.  Genitourinary: Negative for dysuria, hematuria, vaginal bleeding and vaginal discharge.  All other systems reviewed and are negative.   Allergies  Review of patient's allergies indicates no known allergies.  Home Medications   Prior to Admission medications   Medication Sig Start Date End Date Taking? Authorizing Provider  Cholecalciferol (VITAMIN D3) 400 UNITS CAPS Take 1 capsule by mouth daily.      Historical Provider, MD  HYDROcodone-acetaminophen (NORCO/VICODIN) 5-325 MG per tablet Take one by mouth at bedtime as needed for pain 11/24/14   Lattie HawStephen A Karalyne Nusser, MD  levothyroxine (SYNTHROID, LEVOTHROID) 125 MCG tablet Take 125 mcg by mouth daily. On Saturdays and  Sundays take 1 and 1/2 tabs (187.28mcg)     Historical Provider, MD  Prenatal Vit-Fe Fumarate-FA (PRENATAL MULTIVITAMIN) TABS Take 1 tablet by mouth daily.    Historical Provider, MD  Probiotic Product (PROBIOTIC PO) Take by mouth daily.    Historical Provider, MD   Vital Signs:  Temp 90.6 degrees F; Pulse 90; Resp 16; BP 144/51; SpO2 99%; Weight 301lb (136.53kg) Physical Exam Nursing notes and Vital Signs reviewed. Appearance:  Patient appears stated age, and in no acute distress.  Patient is obese. Eyes:  Pupils are equal, round, and  reactive to light and accomodation.  Extraocular movement is intact.  Conjunctivae are not inflamed   Pharynx:  Normal; moist mucous membranes  Neck:  Supple.  No adenopathy Lungs:  Clear to auscultation.  Breath sounds are equal.  Heart:  Regular rate and rhythm without murmurs, rubs, or gallops.  Abdomen:   Tenderness to palpation right lower quadrant without masses or hepatosplenomegaly.  Bowel sounds are present.  No CVA or flank tenderness.   No rebound tenderness.  Tenderness is approximately over McBurney's point.  Negative iliopsoas and obdurator tests. Extremities:  No edema.  No calf tenderness Skin:  No rash present.   Pelvic Exam:  Patient declines ED Course  Procedures  None    Labs Reviewed -  POCT CBC:  WBC 11.0; LY 23.2; MO 4.6; GR 72.2; Hgb 13.6; Platelets 293  POCT urine pregnancy test:  Negative POCT urinalysis:  LEU trace; otherwise negative   Imaging Review Ct Abdomen Pelvis W Contrast  11/24/2014   CLINICAL DATA:  Right lower quadrant abdomen pain and nausea for 1.5 weeks.  EXAM: CT ABDOMEN AND PELVIS WITH CONTRAST  TECHNIQUE: Multidetector CT imaging of the abdomen and pelvis was performed using the standard protocol following bolus administration of intravenous contrast.  CONTRAST:  OMNIPAQUE IOHEXOL 300 MG/ML  SOLN  COMPARISON:  None.  FINDINGS: There is diffuse low density of the liver without vessel displacement. No focal No acute abnormality is identified in the visualized bones. Is identified within the liver. The spleen, pancreas, adrenal glands and kidneys are normal. There is no hydronephrosis bilaterally. The patient is status post prior cholecystectomy. The aorta is normal. There is no abdominal lymphadenopathy. There is no small bowel obstruction or diverticulitis. The appendix is not seen but no inflammation is noted around the cecum to suggest appendicitis.  Fluid-filled bladder is normal. The uterus is normal. There is a 2.3 x 2 cm cyst in normal size  right ovary. The right ovary is just inferior to the cecum and terminal ileum. The lung bases are clear.  IMPRESSION: The appendix is not seen but no inflammation is noted around the right lower quadrant to suggest appendicitis. There is no bowel obstruction.  2.3 x 2 cm cyst in normal size right ovary, likely follicular cyst.  Diffuse fatty infiltration of liver.   Electronically Signed   By: Sherian Rein M.D.   On: 11/24/2014 16:02     MDM   1. Ovarian cyst, right   2. Abdominal pain, RLQ (right lower quadrant)     Patient declined pelvic exam as part of her evaluation, preferring to visit her own GYN today.  However, her GYN was not available and she returned for CT scan abdomen.  Rx for Lortab at bedtime prn May take Ibuprofen , 4 tabs every 8 hours with food.  If symptoms become significantly worse during the night or over the weekend, proceed to the local emergency  room.  Followup with GYN in one week.    Lattie Haw, MD 11/28/14 [id number]  Lattie Haw, MD 11/28/14 [phone]

## 2014-11-24 NOTE — ED Notes (Signed)
[place] c/o RLQ pain intermittent x 1 week. The past 2 days have been constant and worse. C/o some nausea. Denies vomiting, fever, sweats or diarrhea.

## 2014-11-24 NOTE — ED Notes (Signed)
appt sch'ed with Dr. Marice Potterove Monday 11/29/14 2:45pm.

## 2014-11-26 LAB — URINE CULTURE: Colony Count: 25000

## 2014-11-29 ENCOUNTER — Ambulatory Visit (INDEPENDENT_AMBULATORY_CARE_PROVIDER_SITE_OTHER): Payer: Managed Care, Other (non HMO) | Admitting: Obstetrics & Gynecology

## 2014-11-29 ENCOUNTER — Encounter: Payer: Self-pay | Admitting: Obstetrics & Gynecology

## 2014-11-29 VITALS — BP 129/81 | HR 75 | Resp 16 | Ht 64.0 in | Wt 304.0 lb

## 2014-11-29 DIAGNOSIS — N915 Oligomenorrhea, unspecified: Secondary | ICD-10-CM

## 2014-11-29 DIAGNOSIS — E039 Hypothyroidism, unspecified: Secondary | ICD-10-CM

## 2014-11-29 DIAGNOSIS — N8320 Unspecified ovarian cysts: Secondary | ICD-10-CM

## 2014-11-29 DIAGNOSIS — N83201 Unspecified ovarian cyst, right side: Secondary | ICD-10-CM

## 2014-11-29 DIAGNOSIS — E282 Polycystic ovarian syndrome: Secondary | ICD-10-CM

## 2014-11-29 MED ORDER — MEDROXYPROGESTERONE ACETATE 10 MG PO TABS: 10.0000 mg | ORAL_TABLET | Freq: Every day | ORAL | Status: DC

## 2014-11-29 MED ORDER — LEVOTHYROXINE SODIUM 125 MCG PO TABS: 125.0000 ug | ORAL_TABLET | Freq: Every day | ORAL | Status: DC

## 2014-11-29 NOTE — Progress Notes (Signed)
   Subjective:    Patient ID: Audrey Greer, female    DOB: [birth year], 29 y.o.   MRN: [id number]  HPI  29 yo MWP2 (298 yo daughter and 253 yo son) here today as a follow up after urgent care visit 11-24-14 for diagnosis of a right ovarian cyst (2 cm) via CT scan. She was given narcs and is feeling some better now.  Review of Systems LMP 8/15 Uses no contraception. Would be happy to be pregnant. Conceived with metformin and a low carb diet. She is a Futures traderhomemaker. She gets yearly labs through her husband's job. She reports a normal pap smear 7/15.    Objective:   Physical Exam General- morbidly obese pleasant WF, NAD Breathing normally Neurologically intact       Assessment & Plan:  Desire for pregnancy- rec MVI/PNV to help prevent ONTDs Ovarian cyst- feeling better, reassurance given Discussed increased risk of uterine cancer with anovulation. Rec cyclic provera Hypothyroidism- I will refill her synthroid for 1 month since she has not been taking it regularly and will re check TSH in a month

## 2014-12-27 ENCOUNTER — Other Ambulatory Visit: Payer: Managed Care, Other (non HMO)

## 2015-01-03 ENCOUNTER — Other Ambulatory Visit (INDEPENDENT_AMBULATORY_CARE_PROVIDER_SITE_OTHER): Payer: Managed Care, Other (non HMO)

## 2015-01-03 DIAGNOSIS — E039 Hypothyroidism, unspecified: Secondary | ICD-10-CM

## 2015-01-04 LAB — TSH: TSH: 4.476 u[IU]/mL (ref 0.350–4.500)

## 2015-01-05 ENCOUNTER — Telehealth: Payer: Self-pay | Admitting: *Deleted

## 2015-01-05 DIAGNOSIS — E039 Hypothyroidism, unspecified: Secondary | ICD-10-CM

## 2015-01-05 MED ORDER — LEVOTHYROXINE SODIUM 125 MCG PO TABS: 125.0000 ug | ORAL_TABLET | Freq: Every day | ORAL | Status: DC

## 2015-01-05 NOTE — Telephone Encounter (Signed)
Pt notified of TSH results.  She admits to missing a couple of doses.  She was given a 2 RF's but explained that Dr Marice Potterove was not an endocrinologist and she must get an appt with one or start having her PCP manage her hypothyroidism.  Pt states that she used to see [facility] but doesn't want to go back there.  I [place] [facility] [place] and she said they had dismissed her due to rescheduling too many appts.  I suggested Dr Ivan AnchorsHommel @ [facility] and she said her husband sees him and she would f/u him him.  Again I emphasized that we were not managing her hypothyroidism.  Pt voices understanding

## 2015-01-26 ENCOUNTER — Other Ambulatory Visit: Payer: Self-pay | Admitting: Obstetrics & Gynecology

## 2015-04-14 ENCOUNTER — Ambulatory Visit (INDEPENDENT_AMBULATORY_CARE_PROVIDER_SITE_OTHER): Payer: Managed Care, Other (non HMO) | Admitting: Obstetrics & Gynecology

## 2015-04-14 ENCOUNTER — Encounter: Payer: Self-pay | Admitting: Obstetrics & Gynecology

## 2015-04-14 VITALS — BP 139/93 | HR 79 | Ht 64.0 in | Wt 309.0 lb

## 2015-04-14 DIAGNOSIS — R102 Pelvic and perineal pain: Secondary | ICD-10-CM | POA: Diagnosis not present

## 2015-04-14 DIAGNOSIS — Z113 Encounter for screening for infections with a predominantly sexual mode of transmission: Secondary | ICD-10-CM | POA: Diagnosis not present

## 2015-04-14 NOTE — Progress Notes (Signed)
   Subjective:    Patient ID: Audrey Greer, female    DOB: [birth year], 29 y.o.   MRN: [id number]  HPI  29 yo P1 here today because she continues to have right abdominal/pelvic pain, almost every day, sometimes she takes Tylenol or IBU with some relief and some times she takes nothing. She says that it is not related to her periods which have returned to monthly after taking a course of provera. She denies dyspareunia.  She is not actively seeking a pregnancy, but would be happy if she did become pregnant.  Review of Systems She reports daily BMs.    Objective:   Physical Exam WNWHmorbidly obese WFNAD Breathing and ambulating normally Abd- benign       Assessment & Plan:  Right abdominal/pelvic pain- She believes that this pain is due to an ovarian cyst of 2 cm that was seen on a CT 1/16. However, I am not convinced that this pain is gyn in origin.  I will check cervical cultures and a gyn u/s

## 2015-04-15 ENCOUNTER — Other Ambulatory Visit: Payer: Managed Care, Other (non HMO)

## 2015-04-15 LAB — GC/CHLAMYDIA PROBE AMP
CT PROBE, AMP APTIMA: NEGATIVE
GC PROBE AMP APTIMA: NEGATIVE

## 2015-05-14 ENCOUNTER — Other Ambulatory Visit: Payer: Self-pay | Admitting: Obstetrics & Gynecology

## 2015-05-19 ENCOUNTER — Telehealth: Payer: Self-pay | Admitting: *Deleted

## 2015-05-19 NOTE — Telephone Encounter (Signed)
Pharmacy called requesting a RF on Synthroid.  Pt was informed in Feb 2016 that Dr Marice Potterove would RF her Synthroid @ that time but she would not give any further RF's.  Her PCP should follow her thyroid.  Per Epic patient has not seen Dr Ivan AnchorsHommel.

## 2015-11-11 ENCOUNTER — Ambulatory Visit (INDEPENDENT_AMBULATORY_CARE_PROVIDER_SITE_OTHER): Payer: Managed Care, Other (non HMO) | Admitting: Osteopathic Medicine

## 2015-11-11 DIAGNOSIS — N200 Calculus of kidney: Secondary | ICD-10-CM

## 2015-11-11 NOTE — Progress Notes (Signed)
NO SHOW

## 2016-08-13 ENCOUNTER — Ambulatory Visit: Payer: Self-pay | Admitting: General Surgery

## 2016-08-23 LAB — HM PAP SMEAR: HM PAP: NEGATIVE

## 2016-09-27 ENCOUNTER — Ambulatory Visit: Payer: Self-pay | Admitting: General Surgery

## 2016-09-27 ENCOUNTER — Encounter (HOSPITAL_COMMUNITY): Payer: Self-pay

## 2016-09-27 ENCOUNTER — Encounter (HOSPITAL_COMMUNITY)
Admission: RE | Admit: 2016-09-27 | Discharge: 2016-09-27 | Disposition: A | Discharge status: Home / Self Care | Payer: Managed Care, Other (non HMO) | Source: Ambulatory Visit | Attending: General Surgery | Admitting: General Surgery

## 2016-09-27 DIAGNOSIS — Z01810 Encounter for preprocedural cardiovascular examination: Secondary | ICD-10-CM | POA: Diagnosis present

## 2016-09-27 DIAGNOSIS — Z6841 Body Mass Index (BMI) 40.0 and over, adult: Secondary | ICD-10-CM | POA: Diagnosis not present

## 2016-09-27 DIAGNOSIS — J45909 Unspecified asthma, uncomplicated: Secondary | ICD-10-CM | POA: Diagnosis not present

## 2016-09-27 DIAGNOSIS — F419 Anxiety disorder, unspecified: Secondary | ICD-10-CM | POA: Insufficient documentation

## 2016-09-27 DIAGNOSIS — I10 Essential (primary) hypertension: Secondary | ICD-10-CM | POA: Insufficient documentation

## 2016-09-27 DIAGNOSIS — E282 Polycystic ovarian syndrome: Secondary | ICD-10-CM | POA: Insufficient documentation

## 2016-09-27 DIAGNOSIS — K439 Ventral hernia without obstruction or gangrene: Secondary | ICD-10-CM | POA: Diagnosis not present

## 2016-09-27 DIAGNOSIS — Z01812 Encounter for preprocedural laboratory examination: Secondary | ICD-10-CM | POA: Insufficient documentation

## 2016-09-27 HISTORY — DX: Personal history of other diseases of the circulatory system: Z86.79

## 2016-09-27 LAB — HCG, SERUM, QUALITATIVE: PREG SERUM: NEGATIVE

## 2016-09-27 NOTE — Progress Notes (Signed)
PCP- Dr. Edwina BarthStacey Hill Cardiologist - denies  EKG - 09/27/16  CXR - denies Echo/stress test/cardiac cath - denies  Patient denies chest pain and shortness of breath at PAT appointment.  Labs found in Care Everywhere - 09/26/16.

## 2016-09-27 NOTE — Pre-Procedure Instructions (Signed)
Saralyn PilarLauren Lloyd  09/27/2016      [id number] - [place], [place] - [address] [place] [id number] Phone: [phone] Fax: [phone]    Your procedure is scheduled on Monday, November 20th, 2017.  [facility].M.   Call this number if you have problems the morning of surgery:  [phone]   Remember:  Do not eat food or drink liquids after midnight.   Take these medicines the morning of surgery with A SIP OF WATER: Albuterol Inhaler if needed (please bring with you), Levothyroxine (synthroid).  Stop taking: Aspirin, NSAIDS, Aleve, Naproxen, Ibuprofen, Advil, Motrin, BC's, Goody's, Fish oil, all herbal medications, and all vitamins.    Do not wear jewelry, make-up or nail polish.  Do not wear lotions, powders, or perfumes, or deoderant.  Do not shave 48 hours prior to surgery.    Do not bring valuables to the hospital.  [facility] is not responsible for any belongings or valuables.  Contacts, dentures or bridgework may not be worn into surgery.  Leave your suitcase in the car.  After surgery it may be brought to your room.  For patients admitted to the hospital, discharge time will be determined by your treatment team.  Patients discharged the day of surgery will not be allowed to drive home.   Special instructions:  Preparing for Surgery.   [place]- Preparing For Surgery  Before surgery, you can play an important role. Because skin is not sterile, your skin needs to be as free of germs as possible. You can reduce the number of germs on your skin by washing with CHG (chlorahexidine gluconate) Soap before surgery.  CHG is an antiseptic cleaner which kills germs and bonds with the skin to continue killing germs even after washing.  Please do not use if you have an allergy to CHG or antibacterial soaps. If your skin becomes reddened/irritated stop using the CHG.  Do not shave (including  legs and underarms) for at least 48 hours prior to first CHG shower. It is OK to shave your face.  Please follow these instructions carefully.   1. Shower the NIGHT BEFORE SURGERY and the MORNING OF SURGERY with CHG.   2. If you chose to wash your hair, wash your hair first as usual with your normal shampoo.  3. After you shampoo, rinse your hair and body thoroughly to remove the shampoo.  4. Use CHG as you would any other liquid soap. You can apply CHG directly to the skin and wash gently with a scrungie or a clean washcloth.   5. Apply the CHG Soap to your body ONLY FROM THE NECK DOWN.  Do not use on open wounds or open sores. Avoid contact with your eyes, ears, mouth and genitals (private parts). Wash genitals (private parts) with your normal soap.  6. Wash thoroughly, paying special attention to the area where your surgery will be performed.  7. Thoroughly rinse your body with warm water from the neck down.  8. DO NOT shower/wash with your normal soap after using and rinsing off the CHG Soap.  9. Pat yourself dry with a CLEAN TOWEL.   10. Wear CLEAN PAJAMAS   11. Place CLEAN SHEETS on your bed the night of your first shower and DO NOT SLEEP WITH PETS.  Day of Surgery: Do not apply any deodorants/lotions. Please wear clean clothes to the hospital/surgery center.    Please read over  the following fact sheets that you were given.

## 2016-09-28 MED ORDER — DEXTROSE 5 % IV SOLN
3.0000 g | INTRAVENOUS | Status: AC
  Administered 2016-10-01: 3 g via INTRAVENOUS
  Filled 2016-09-28: qty 3000

## 2016-09-30 MED ORDER — CHLORHEXIDINE GLUCONATE CLOTH 2 % EX PADS: 6.0000 | MEDICATED_PAD | Freq: Once | CUTANEOUS | Status: DC

## 2016-10-01 ENCOUNTER — Ambulatory Visit (HOSPITAL_COMMUNITY)
Admission: RE | Admit: 2016-10-01 | Discharge: 2016-10-02 | Disposition: A | Discharge status: Home / Self Care | Payer: Managed Care, Other (non HMO) | Source: Ambulatory Visit | Attending: General Surgery | Admitting: General Surgery

## 2016-10-01 ENCOUNTER — Ambulatory Visit (HOSPITAL_COMMUNITY): Payer: Managed Care, Other (non HMO) | Admitting: Certified Registered"

## 2016-10-01 ENCOUNTER — Encounter (HOSPITAL_COMMUNITY): Payer: Self-pay | Admitting: *Deleted

## 2016-10-01 ENCOUNTER — Encounter (HOSPITAL_COMMUNITY)
Admission: RE | Disposition: A | Discharge status: Home / Self Care | Payer: Self-pay | Source: Ambulatory Visit | Attending: General Surgery

## 2016-10-01 DIAGNOSIS — J45909 Unspecified asthma, uncomplicated: Secondary | ICD-10-CM | POA: Insufficient documentation

## 2016-10-01 DIAGNOSIS — K439 Ventral hernia without obstruction or gangrene: Secondary | ICD-10-CM | POA: Diagnosis present

## 2016-10-01 DIAGNOSIS — I10 Essential (primary) hypertension: Secondary | ICD-10-CM | POA: Diagnosis not present

## 2016-10-01 DIAGNOSIS — E039 Hypothyroidism, unspecified: Secondary | ICD-10-CM | POA: Diagnosis not present

## 2016-10-01 DIAGNOSIS — K432 Incisional hernia without obstruction or gangrene: Secondary | ICD-10-CM | POA: Insufficient documentation

## 2016-10-01 DIAGNOSIS — F419 Anxiety disorder, unspecified: Secondary | ICD-10-CM | POA: Insufficient documentation

## 2016-10-01 HISTORY — DX: Pneumonia, unspecified organism: J18.9

## 2016-10-01 HISTORY — DX: Migraine, unspecified, not intractable, without status migrainosus: G43.909

## 2016-10-01 HISTORY — DX: Unspecified chronic bronchitis: J42

## 2016-10-01 HISTORY — PX: VENTRAL HERNIA REPAIR: SHX424

## 2016-10-01 HISTORY — PX: INSERTION OF MESH: SHX5868

## 2016-10-01 HISTORY — PX: LAPAROSCOPIC ASSISTED VENTRAL HERNIA REPAIR: SHX6312

## 2016-10-01 SURGERY — REPAIR, HERNIA, VENTRAL, LAPAROSCOPIC
Anesthesia: General | Site: Abdomen

## 2016-10-01 MED ORDER — FENTANYL CITRATE (PF) 100 MCG/2ML IJ SOLN
INTRAMUSCULAR | Status: AC
  Filled 2016-10-01: qty 2

## 2016-10-01 MED ORDER — PROMETHAZINE HCL 25 MG/ML IJ SOLN
INTRAMUSCULAR | Status: AC
  Filled 2016-10-01: qty 1

## 2016-10-01 MED ORDER — HEPARIN SODIUM (PORCINE) 5000 UNIT/ML IJ SOLN
5000.0000 [IU] | Freq: Three times a day (TID) | INTRAMUSCULAR | Status: DC
  Administered 2016-10-02: 5000 [IU] via SUBCUTANEOUS
  Filled 2016-10-01: qty 1

## 2016-10-01 MED ORDER — MIDAZOLAM HCL 2 MG/2ML IJ SOLN
INTRAMUSCULAR | Status: AC
  Filled 2016-10-01: qty 2

## 2016-10-01 MED ORDER — MORPHINE SULFATE (PF) 2 MG/ML IV SOLN
1.0000 mg | INTRAVENOUS | Status: DC | PRN
  Administered 2016-10-01 – 2016-10-02 (×2): 2 mg via INTRAVENOUS
  Filled 2016-10-01 (×2): qty 1

## 2016-10-01 MED ORDER — ONDANSETRON HCL 4 MG/2ML IJ SOLN: 4.0000 mg | Freq: Four times a day (QID) | INTRAMUSCULAR | Status: DC | PRN

## 2016-10-01 MED ORDER — DEXAMETHASONE SODIUM PHOSPHATE 10 MG/ML IJ SOLN
INTRAMUSCULAR | Status: DC | PRN
  Administered 2016-10-01: 6 mg via INTRAVENOUS

## 2016-10-01 MED ORDER — 0.9 % SODIUM CHLORIDE (POUR BTL) OPTIME
TOPICAL | Status: DC | PRN
  Administered 2016-10-01: 1000 mL

## 2016-10-01 MED ORDER — HYDROMORPHONE HCL 1 MG/ML IJ SOLN
0.2500 mg | INTRAMUSCULAR | Status: DC | PRN
  Administered 2016-10-01 (×3): 0.5 mg via INTRAVENOUS

## 2016-10-01 MED ORDER — LIDOCAINE HCL (CARDIAC) 20 MG/ML IV SOLN
INTRAVENOUS | Status: DC | PRN
  Administered 2016-10-01: 50 mg via INTRAVENOUS

## 2016-10-01 MED ORDER — BUPIVACAINE HCL (PF) 0.25 % IJ SOLN
INTRAMUSCULAR | Status: AC
  Filled 2016-10-01: qty 30

## 2016-10-01 MED ORDER — ROCURONIUM BROMIDE 10 MG/ML (PF) SYRINGE
PREFILLED_SYRINGE | INTRAVENOUS | Status: AC
  Filled 2016-10-01: qty 10

## 2016-10-01 MED ORDER — ALBUTEROL SULFATE (2.5 MG/3ML) 0.083% IN NEBU: 3.0000 mL | INHALATION_SOLUTION | Freq: Four times a day (QID) | RESPIRATORY_TRACT | Status: DC | PRN

## 2016-10-01 MED ORDER — LIDOCAINE 2% (20 MG/ML) 5 ML SYRINGE
INTRAMUSCULAR | Status: AC
  Filled 2016-10-01: qty 5

## 2016-10-01 MED ORDER — LACTATED RINGERS IV SOLN: INTRAVENOUS | Status: DC

## 2016-10-01 MED ORDER — HYDROMORPHONE HCL 1 MG/ML IJ SOLN
INTRAMUSCULAR | Status: AC
Start: 2016-10-01 — End: 2016-10-02
  Filled 2016-10-01: qty 0.5

## 2016-10-01 MED ORDER — PROPOFOL 10 MG/ML IV BOLUS
INTRAVENOUS | Status: DC | PRN
Start: 2016-10-01 — End: 2016-10-01
  Administered 2016-10-01: 200 mg via INTRAVENOUS

## 2016-10-01 MED ORDER — HYDROMORPHONE HCL 1 MG/ML IJ SOLN
INTRAMUSCULAR | Status: AC
Start: 2016-10-01 — End: 2016-10-02
  Filled 2016-10-01: qty 1

## 2016-10-01 MED ORDER — BUPIVACAINE HCL (PF) 0.25 % IJ SOLN
INTRAMUSCULAR | Status: DC | PRN
  Administered 2016-10-01: 14 mL

## 2016-10-01 MED ORDER — FENTANYL CITRATE (PF) 100 MCG/2ML IJ SOLN
INTRAMUSCULAR | Status: DC | PRN
Start: 2016-10-01 — End: 2016-10-01
  Administered 2016-10-01 (×6): 50 ug via INTRAVENOUS

## 2016-10-01 MED ORDER — HYDROCODONE-ACETAMINOPHEN 5-325 MG PO TABS
ORAL_TABLET | ORAL | Status: AC
  Filled 2016-10-01: qty 2

## 2016-10-01 MED ORDER — PANTOPRAZOLE SODIUM 40 MG IV SOLR
40.0000 mg | Freq: Every day | INTRAVENOUS | Status: DC
  Administered 2016-10-01: 40 mg via INTRAVENOUS
  Filled 2016-10-01: qty 40

## 2016-10-01 MED ORDER — PHENYLEPHRINE HCL 10 MG/ML IJ SOLN
INTRAMUSCULAR | Status: AC
  Filled 2016-10-01: qty 1

## 2016-10-01 MED ORDER — MEPERIDINE HCL 25 MG/ML IJ SOLN: 6.2500 mg | INTRAMUSCULAR | Status: DC | PRN

## 2016-10-01 MED ORDER — LACTATED RINGERS IV SOLN
INTRAVENOUS | Status: DC
Start: 2016-10-01 — End: 2016-10-02
  Administered 2016-10-01 (×2): via INTRAVENOUS

## 2016-10-01 MED ORDER — METHOCARBAMOL 500 MG PO TABS
500.0000 mg | ORAL_TABLET | Freq: Four times a day (QID) | ORAL | Status: DC | PRN
  Administered 2016-10-01 – 2016-10-02 (×5): 500 mg via ORAL
  Filled 2016-10-01 (×4): qty 1

## 2016-10-01 MED ORDER — METHOCARBAMOL 500 MG PO TABS
ORAL_TABLET | ORAL | Status: AC
  Filled 2016-10-01: qty 1

## 2016-10-01 MED ORDER — MIDAZOLAM HCL 5 MG/5ML IJ SOLN
INTRAMUSCULAR | Status: DC | PRN
  Administered 2016-10-01: 2 mg via INTRAVENOUS

## 2016-10-01 MED ORDER — ONDANSETRON HCL 4 MG/2ML IJ SOLN
INTRAMUSCULAR | Status: AC
  Filled 2016-10-01: qty 2

## 2016-10-01 MED ORDER — SUGAMMADEX SODIUM 500 MG/5ML IV SOLN
INTRAVENOUS | Status: DC | PRN
  Administered 2016-10-01: 300 mg via INTRAVENOUS

## 2016-10-01 MED ORDER — SUGAMMADEX SODIUM 500 MG/5ML IV SOLN
INTRAVENOUS | Status: AC
  Filled 2016-10-01: qty 5

## 2016-10-01 MED ORDER — LEVOTHYROXINE SODIUM 25 MCG PO TABS
137.0000 ug | ORAL_TABLET | Freq: Every day | ORAL | Status: DC
  Administered 2016-10-02: 137 ug via ORAL
  Filled 2016-10-01: qty 1

## 2016-10-01 MED ORDER — ONDANSETRON HCL 4 MG/2ML IJ SOLN
INTRAMUSCULAR | Status: DC | PRN
Start: 2016-10-01 — End: 2016-10-01
  Administered 2016-10-01: 4 mg via INTRAVENOUS

## 2016-10-01 MED ORDER — KCL IN DEXTROSE-NACL 20-5-0.9 MEQ/L-%-% IV SOLN
INTRAVENOUS | Status: DC
  Administered 2016-10-01: 22:00:00 via INTRAVENOUS
  Filled 2016-10-01 (×5): qty 1000

## 2016-10-01 MED ORDER — ROCURONIUM BROMIDE 100 MG/10ML IV SOLN
INTRAVENOUS | Status: DC | PRN
  Administered 2016-10-01: 10 mg via INTRAVENOUS
  Administered 2016-10-01: 50 mg via INTRAVENOUS
  Administered 2016-10-01: 10 mg via INTRAVENOUS

## 2016-10-01 MED ORDER — PROPOFOL 10 MG/ML IV BOLUS
INTRAVENOUS | Status: AC
  Filled 2016-10-01: qty 20

## 2016-10-01 MED ORDER — ONDANSETRON 4 MG PO TBDP: 4.0000 mg | ORAL_TABLET | Freq: Four times a day (QID) | ORAL | Status: DC | PRN

## 2016-10-01 MED ORDER — PROMETHAZINE HCL 25 MG/ML IJ SOLN
6.2500 mg | INTRAMUSCULAR | Status: DC | PRN
  Administered 2016-10-01: 12.5 mg via INTRAVENOUS

## 2016-10-01 MED ORDER — HYDROCODONE-ACETAMINOPHEN 5-325 MG PO TABS
1.0000 | ORAL_TABLET | ORAL | Status: DC | PRN
Start: 2016-10-01 — End: 2016-10-02
  Administered 2016-10-01 – 2016-10-02 (×5): 2 via ORAL
  Filled 2016-10-01 (×4): qty 2

## 2016-10-01 SURGICAL SUPPLY — 49 items
APPLIER CLIP LOGIC TI 5 (MISCELLANEOUS) IMPLANT
APPLIER CLIP ROT 10 11.4 M/L (STAPLE)
BINDER ABDOMINAL 12 ML 46-62 (SOFTGOODS) ×4 IMPLANT
BNDG GAUZE ELAST 4 BULKY (GAUZE/BANDAGES/DRESSINGS) IMPLANT
CANISTER SUCTION 2500CC (MISCELLANEOUS) IMPLANT
CHLORAPREP W/TINT 26ML (MISCELLANEOUS) ×4 IMPLANT
CLIP APPLIE ROT 10 11.4 M/L (STAPLE) IMPLANT
COVER SURGICAL LIGHT HANDLE (MISCELLANEOUS) ×4 IMPLANT
DERMABOND ADVANCED (GAUZE/BANDAGES/DRESSINGS) ×2
DERMABOND ADVANCED .7 DNX12 (GAUZE/BANDAGES/DRESSINGS) ×2 IMPLANT
DEVICE SECURE STRAP 25 ABSORB (INSTRUMENTS) ×8 IMPLANT
DEVICE TROCAR PUNCTURE CLOSURE (ENDOMECHANICALS) ×4 IMPLANT
DRAPE INCISE IOBAN 66X45 STRL (DRAPES) ×4 IMPLANT
DRAPE LAPAROSCOPIC ABDOMINAL (DRAPES) ×4 IMPLANT
ELECT CAUTERY BLADE 6.4 (BLADE) ×4 IMPLANT
ELECT REM PT RETURN 9FT ADLT (ELECTROSURGICAL) ×4
ELECTRODE REM PT RTRN 9FT ADLT (ELECTROSURGICAL) ×2 IMPLANT
GLOVE BIO SURGEON STRL SZ7 (GLOVE) ×4 IMPLANT
GLOVE BIO SURGEON STRL SZ7.5 (GLOVE) ×4 IMPLANT
GLOVE BIOGEL PI IND STRL 7.0 (GLOVE) ×4 IMPLANT
GLOVE BIOGEL PI INDICATOR 7.0 (GLOVE) ×4
GOWN STRL REUS W/ TWL LRG LVL3 (GOWN DISPOSABLE) ×6 IMPLANT
GOWN STRL REUS W/TWL LRG LVL3 (GOWN DISPOSABLE) ×6
KIT BASIN OR (CUSTOM PROCEDURE TRAY) ×4 IMPLANT
KIT ROOM TURNOVER OR (KITS) ×4 IMPLANT
MARKER SKIN DUAL TIP RULER LAB (MISCELLANEOUS) ×4 IMPLANT
MESH VENTRALIGHT ST 4.5IN (Mesh General) ×4 IMPLANT
NEEDLE SPNL 22GX3.5 QUINCKE BK (NEEDLE) ×4 IMPLANT
NS IRRIG 1000ML POUR BTL (IV SOLUTION) ×4 IMPLANT
PAD ARMBOARD 7.5X6 YLW CONV (MISCELLANEOUS) ×8 IMPLANT
PENCIL BUTTON HOLSTER BLD 10FT (ELECTRODE) ×4 IMPLANT
SCALPEL HARMONIC ACE (MISCELLANEOUS) IMPLANT
SCISSORS LAP 5X35 DISP (ENDOMECHANICALS) IMPLANT
SET IRRIG TUBING LAPAROSCOPIC (IRRIGATION / IRRIGATOR) IMPLANT
SHEARS HARMONIC ACE PLUS 36CM (ENDOMECHANICALS) ×4 IMPLANT
SLEEVE ENDOPATH XCEL 5M (ENDOMECHANICALS) ×4 IMPLANT
SPONGE LAP 18X18 X RAY DECT (DISPOSABLE) ×4 IMPLANT
SUT MNCRL AB 4-0 PS2 18 (SUTURE) ×4 IMPLANT
SUT NOVA NAB DX-16 0-1 5-0 T12 (SUTURE) ×4 IMPLANT
SUT VIC AB 3-0 SH 27 (SUTURE) ×2
SUT VIC AB 3-0 SH 27XBRD (SUTURE) ×2 IMPLANT
TOWEL OR 17X24 6PK STRL BLUE (TOWEL DISPOSABLE) IMPLANT
TOWEL OR 17X26 10 PK STRL BLUE (TOWEL DISPOSABLE) ×4 IMPLANT
TRAY FOLEY CATH 16FR SILVER (SET/KITS/TRAYS/PACK) ×4 IMPLANT
TRAY LAPAROSCOPIC MC (CUSTOM PROCEDURE TRAY) ×4 IMPLANT
TROCAR XCEL BLUNT TIP 100MML (ENDOMECHANICALS) IMPLANT
TROCAR XCEL NON-BLD 11X100MML (ENDOMECHANICALS) IMPLANT
TROCAR XCEL NON-BLD 5MMX100MML (ENDOMECHANICALS) ×4 IMPLANT
TUBING INSUFFLATION (TUBING) ×4 IMPLANT

## 2016-10-01 NOTE — Op Note (Signed)
10/01/2016  1:05 PM  PATIENT:  Audrey PilarLauren Greer  30 y.o. female  PRE-OPERATIVE DIAGNOSIS:  VENTRAL HERNIA  POST-OPERATIVE DIAGNOSIS:  VENTRAL HERNIA  PROCEDURE:  Procedure(s): LAPAROSCOPIC VENTRAL HERNIA WITH MESH (N/A) INSERTION OF MESH (N/A)  SURGEON:  Surgeon(s) and Role:    * Griselda MinerPaul Toth III, MD - Primary  PHYSICIAN ASSISTANT:   ASSISTANTS: Myrtie SomanSharon Hitchcock, RNFA   ANESTHESIA:   general  EBL:  Total I/O In: 1200 [I.V.:1200] Out: 320 [Urine:300; Blood:20]  BLOOD ADMINISTERED:none  DRAINS: none   LOCAL MEDICATIONS USED:  MARCAINE     SPECIMEN:  No Specimen  DISPOSITION OF SPECIMEN:  N/A  COUNTS:  YES  TOURNIQUET:  * No tourniquets in log *  DICTATION: .Dragon Dictation   After informed consent was obtained the patient was brought to the operating room and placed in the supine position on the operating room table. After adequate induction of general anesthesia the patient's abdomen was prepped with ChloraPrep, allowed to dry, and draped in usual sterile manner including the use of an Ioban drape. An appropriate timeout was performed. Next a site was chosen to access the abdominal cavity in the left upper quadrant. This area was infiltrated with quarter percent Marcaine. A small stab incision was made with a 15 blade knife. A 5 mm Optiview port and camera were used to bluntly dissected the layers of the abdominal wall under direct vision until access was gained to the abdominal cavity. The abdomen was then insufflated with carbon dioxide without difficulty. The abdomen was generally inspected. There were some omental adhesions to the anterior abdominal wall. The rest of the bowel and liver had a normal appearance. There was no apparent injury. There was a large ovarian cyst with evidence of recent rupture in the right low pelvis. At this point another 5 mm port was placed in the left lower quadrant under direct vision. The Harmonic scalpel was used to take down the adhesions to  the abdominal wall. Once this was accomplished we were able to examine the abdominal wall and there was a small fascial defect at the umbilicus. Next I made a small incision in the skin just inferior to the umbilicus with a 15 blade knife. The incision was carried through the skin and subcutaneous tissue sharply with electrocautery until the fascia of the abdominal wall was encountered. The hernia was opened and the hernia sac with its contents including some omentum were removed. Next a circular piece of ventral light mesh measuring 11 cm was chosen. 4 #1 Novafil stitches were placed at equidistant points around the edge of the mesh. The mesh was oriented with the coated side towards the bowel. The mesh was inserted through the opening at the umbilicus into the abdominal cavity. The fascial defect was then closed with a figure-of-eight #1 Novafil stitch. The abdomen was then insufflated again. The mesh was oriented properly. 4 small stab incisions were made at points corresponding to the tails of the mesh. A suture passer was used to bring the tails of each stitch through the abdominal wall at each point did each of these stitches was then cinched down and tied thereby bringing the mesh into good apposition to the abdominal wall. This was accomplished mesh was in good position. The gaps between the stitches were filled in with a secure strap absorbable tacker. Once this was accomplished the mesh was in good position with no redundancy or gaps. The area was examined and found to be hemostatic. At this point the gas  was allowed to escape and the mesh was observed to remain in good apposition to the abdominal wall. The ports were removed. The incisions were then closed with interrupted 4-0 Monocryl subcuticular stitches. Dermabond dressings were applied. The patient tolerated the procedure well. At the end of the case all needle sponge and ischemic counts were correct. The patient was then awakened and taken to  recovery in stable condition.  PLAN OF CARE: Admit for overnight observation  PATIENT DISPOSITION:  PACU - hemodynamically stable.   Delay start of Pharmacological VTE agent (>24hrs) due to surgical blood loss or risk of bleeding: no

## 2016-10-01 NOTE — Transfer of Care (Signed)
Immediate Anesthesia Transfer of Care Note  Patient: Audrey Greer  Procedure(s) Performed: Procedure(s): LAPAROSCOPIC VENTRAL HERNIA WITH MESH (N/A) INSERTION OF MESH (N/A)  Patient Location: PACU  Anesthesia Type:General  Level of Consciousness: awake, alert , oriented and patient cooperative  Airway & Oxygen Therapy: Patient Spontanous Breathing and Patient connected to nasal cannula oxygen  Post-op Assessment: Report given to RN, Post -op Vital signs reviewed and stable and Patient moving all extremities  Post vital signs: Reviewed and stable  Last Vitals:  Vitals:   10/01/16 0822 10/01/16 1321  BP: 132/71 121/62  Pulse: 84 (!) 106  Resp: 18 20  Temp: 37.2 C 36.9 C    Last Pain:  Vitals:   10/01/16 0822  TempSrc: Oral         Complications: No apparent anesthesia complications

## 2016-10-01 NOTE — Progress Notes (Addendum)
1625 received pt from PACU, A&O x4, abd incisions w/ liquid adhesives dry and intact. Abd binder adjusted.  Started pt on clear liquid diet, tolerated.

## 2016-10-01 NOTE — Interval H&P Note (Signed)
History and Physical Interval Note:  10/01/2016 10:02 AM  Audrey Greer  has presented today for surgery, with the diagnosis of VENTRAL HERNIA  The various methods of treatment have been discussed with the patient and family. After consideration of risks, benefits and other options for treatment, the patient has consented to  Procedure(s): LAPAROSCOPIC VENTRAL HERNIA WITH MESH (N/A) INSERTION OF MESH (N/A) as a surgical intervention .  The patient's history has been reviewed, patient examined, no change in status, stable for surgery.  I have reviewed the patient's chart and labs.  Questions were answered to the patient's satisfaction.     TOTH III,Macyn Shropshire S

## 2016-10-01 NOTE — H&P (Signed)
Audrey Greer  [facility] Patient #: [id number] DOB: 12-23-1985 Married / Language: English / Race: White Female   History of Present Illness  Patient words: hernia.  The patient is a 30 year old female who presents with an incisional hernia. 2 presents after an episode of pain and swelling at her umbilicus. This occurred for the first time a few months ago. She described the pain as severe. The pain was not associated with nausea or vomiting. After several hours the pain began to subside and the knot got smaller. She does have a history of [place].   Other Problems  Anxiety Disorder Asthma High blood pressure Thyroid Disease Umbilical Hernia Repair  Past Surgical History  Cesarean Section - 1 Gallbladder Surgery - Laparoscopic Oral Surgery Tonsillectomy  Diagnostic Studies History  Colonoscopy never Mammogram never Pap Smear 1-5 years ago  Allergies  No Known Drug Allergies  Medication History  Levothyroxine Sodium (137MCG Tablet, Oral) Active. Medications Reconciled  Social History Alcohol use Occasional alcohol use. Caffeine use Coffee. No drug use Tobacco use Never smoker.  Family History  Alcohol Abuse Mother. Arthritis Father, Mother. Diabetes Mellitus Father. Heart disease in female family member before age 30 Hypertension Father. Migraine Headache Mother. Thyroid problems Father.  Pregnancy / Birth History  Age at menarche 10 years. Contraceptive History Oral contraceptives. Gravida 3 Irregular periods Length (months) of breastfeeding >24 Maternal age 30-25 Para 2    Review of Systems General Not Present- Appetite Loss, Chills, Fatigue, Fever, Night Sweats, Weight Gain and Weight Loss. Skin Not Present- Change in Wart/Mole, Dryness, Hives, Jaundice, New Lesions, Non-Healing Wounds, Rash and Ulcer. HEENT Present-  Wears glasses/contact lenses. Not Present- Earache, Hearing Loss, Hoarseness, Nose Bleed, Oral Ulcers, Ringing in the Ears, Seasonal Allergies, Sinus Pain, Sore Throat, Visual Disturbances and Yellow Eyes. Respiratory Not Present- Bloody sputum, Chronic Cough, Difficulty Breathing, Snoring and Wheezing. Breast Not Present- Breast Mass, Breast Pain, Nipple Discharge and Skin Changes. Cardiovascular Not Present- Chest Pain, Difficulty Breathing Lying Down, Leg Cramps, Palpitations, Rapid Heart Rate, Shortness of Breath and Swelling of Extremities. Gastrointestinal Not Present- Abdominal Pain, Bloating, Bloody Stool, Change in Bowel Habits, Chronic diarrhea, Constipation, Difficulty Swallowing, Excessive gas, Gets full quickly at meals, Hemorrhoids, Indigestion, Nausea, Rectal Pain and Vomiting. Female Genitourinary Not Present- Frequency, Nocturia, Painful Urination, Pelvic Pain and Urgency. Musculoskeletal Not Present- Back Pain, Joint Pain, Joint Stiffness, Muscle Pain, Muscle Weakness and Swelling of Extremities. Neurological Not Present- Decreased Memory, Fainting, Headaches, Numbness, Seizures, Tingling, Tremor, Trouble walking and Weakness. Psychiatric Present- Anxiety. Not Present- Bipolar, Change in Sleep Pattern, Depression, Fearful and Frequent crying. Endocrine Not Present- Cold Intolerance, Excessive Hunger, Hair Changes, Heat Intolerance, Hot flashes and New Diabetes. Hematology Not Present- Blood Thinners, Easy Bruising, Excessive bleeding, Gland problems, HIV and Persistent Infections.  Vitals Weight: 287.5 lb Height: 64in Body Surface Area: 2.28 m Body Mass Index: 49.35 kg/m  BP: 124/84 (Sitting, Left Arm, Standard)       Physical Exam  General Mental Status-Alert. General Appearance-Consistent with stated age. Hydration-Well hydrated. Voice-Normal.  Head and Neck Head-normocephalic, atraumatic with no lesions or palpable  masses. Trachea-midline. Thyroid Gland Characteristics - normal size and consistency.  Eye Eyeball - Bilateral-Extraocular movements intact. Sclera/Conjunctiva - Bilateral-No scleral icterus.  Chest and Lung Exam Chest and lung exam reveals -quiet, even and easy respiratory effort with no use of accessory muscles and on auscultation, normal breath sounds, no adventitious sounds and normal vocal  resonance. Inspection Chest Wall - Normal. Back - normal.  Cardiovascular Cardiovascular examination reveals -normal heart sounds, regular rate and rhythm with no murmurs and normal pedal pulses bilaterally.  Abdomen Note: The abdomen is soft and nontender. There is a small bulge near the umbilicus that is minimally tender but does not reduce. There is no sign of obstruction. There is no redness to the skin.   Neurologic Neurologic evaluation reveals -alert and oriented x 3 with no impairment of recent or remote memory. Mental Status-Normal.  Musculoskeletal Normal Exam - Left-Upper Extremity Strength Normal and Lower Extremity Strength Normal. Normal Exam - Right-Upper Extremity Strength Normal and Lower Extremity Strength Normal.  Lymphatic Head & Neck  General Head & Neck Lymphatics: Bilateral - Description - Normal. Axillary  General Axillary Region: Bilateral - Description - Normal. Tenderness - Non Tender. Femoral & Inguinal  Generalized Femoral & Inguinal Lymphatics: Bilateral - Description - Normal. Tenderness - Non Tender.    Assessment & Plan  VENTRAL HERNIA WITHOUT OBSTRUCTION OR GANGRENE (K43.9) Impression: The patient appears to have a small ventral hernia at the umbilicus likely secondary to her previous gallbladder surgery. Because of the risk of incarceration and strangulation I think she would benefit from having the hernia fixed. I have discussed with her in detail the risks and benefits of the operation to fix the hernia as well as some of the  technical aspects and she understands and wishes to proceed. I will plan for a laparoscopic ventral hernia repair with mesh Current Plans Pt Education - Hernia: discussed with patient and provided information.

## 2016-10-01 NOTE — Anesthesia Procedure Notes (Signed)
Procedure Name: Intubation Date/Time: 10/01/2016 11:36 AM Performed by: Lucinda DellECARLO, Adea Geisel M Pre-anesthesia Checklist: Patient identified, Emergency Drugs available, Suction available and Patient being monitored Patient Re-evaluated:Patient Re-evaluated prior to inductionOxygen Delivery Method: Circle system utilized Preoxygenation: Pre-oxygenation with 100% oxygen Intubation Type: IV induction Ventilation: Mask ventilation without difficulty Laryngoscope Size: Mac and 3 Grade View: Grade I Tube type: Oral Tube size: 7.0 mm Number of attempts: 1 Airway Equipment and Method: Stylet Placement Confirmation: ETT inserted through vocal cords under direct vision,  positive ETCO2 and breath sounds checked- equal and bilateral Secured at: 21 cm Tube secured with: Tape Dental Injury: Teeth and Oropharynx as per pre-operative assessment

## 2016-10-01 NOTE — Anesthesia Postprocedure Evaluation (Signed)
Anesthesia Post Note  Patient: Saralyn PilarLauren Dall  Procedure(s) Performed: Procedure(s) (LRB): LAPAROSCOPIC VENTRAL HERNIA WITH MESH (N/A) INSERTION OF MESH (N/A)  Patient location during evaluation: PACU Anesthesia Type: General Level of consciousness: awake and alert Pain management: pain level controlled Vital Signs Assessment: post-procedure vital signs reviewed and stable Respiratory status: spontaneous breathing, nonlabored ventilation, respiratory function stable and patient connected to nasal cannula oxygen Cardiovascular status: blood pressure returned to baseline and stable Postop Assessment: no signs of nausea or vomiting Anesthetic complications: no    Last Vitals:  Vitals:   10/01/16 1330 10/01/16 1349  BP:  104/60  Pulse: (!) 103 96  Resp: (!) 25 20  Temp:      Last Pain:  Vitals:   10/01/16 0822  TempSrc: Oral                 Shelton SilvasKevin D Hollis

## 2016-10-01 NOTE — Anesthesia Preprocedure Evaluation (Addendum)
Anesthesia Evaluation  Patient identified by MRN, date of birth, ID band Patient awake    Reviewed: Allergy & Precautions, Patient's Chart, lab work & pertinent test results  Airway Mallampati: III       Dental  (+) Teeth Intact, Dental Advisory Given   Pulmonary asthma ,    breath sounds clear to auscultation       Cardiovascular negative cardio ROS   Rhythm:Regular Rate:Normal     Neuro/Psych PSYCHIATRIC DISORDERS Anxiety negative neurological ROS     GI/Hepatic negative GI ROS, Neg liver ROS,   Endo/Other  Hypothyroidism   Renal/GU negative Renal ROS  negative genitourinary   Musculoskeletal negative musculoskeletal ROS (+)   Abdominal   Peds negative pediatric ROS (+)  Hematology negative hematology ROS (+)   Anesthesia Other Findings   Reproductive/Obstetrics negative OB ROS                            Lab Results  Component Value Date   WBC 11.3 (H) 01/01/2012   HGB 10.5 (L) 01/01/2012   HCT 33.3 (L) 01/01/2012   MCV 87.9 01/01/2012   PLT 203 01/01/2012   Lab Results  Component Value Date   CREATININE 0.50 12/30/2011   BUN 4 (L) 12/30/2011   NA 136 12/30/2011   K 4.2 12/30/2011   CL 102 12/30/2011   CO2 23 12/30/2011   No results found for: INR, PROTIME  09/2016 EKG: normal sinus rhythm.  Anesthesia Physical Anesthesia Plan  ASA: II  Anesthesia Plan: General   Post-op Pain Management:    Induction: Intravenous  Airway Management Planned: Oral ETT  Additional Equipment:   Intra-op Plan:   Post-operative Plan: Extubation in OR  Informed Consent: I have reviewed the patients History and Physical, chart, labs and discussed the procedure including the risks, benefits and alternatives for the proposed anesthesia with the patient or authorized representative who has indicated his/her understanding and acceptance.   Dental advisory given  Plan Discussed  with: CRNA  Anesthesia Plan Comments:         Anesthesia Quick Evaluation

## 2016-10-02 ENCOUNTER — Encounter (HOSPITAL_COMMUNITY): Payer: Self-pay | Admitting: General Surgery

## 2016-10-02 DIAGNOSIS — K432 Incisional hernia without obstruction or gangrene: Secondary | ICD-10-CM | POA: Diagnosis not present

## 2016-10-02 MED ORDER — POLYETHYLENE GLYCOL 3350 17 G PO PACK
17.0000 g | PACK | Freq: Every day | ORAL | Status: DC
  Administered 2016-10-02: 17 g via ORAL
  Filled 2016-10-02: qty 1

## 2016-10-02 MED ORDER — METHOCARBAMOL 500 MG PO TABS: 500.0000 mg | ORAL_TABLET | Freq: Four times a day (QID) | ORAL | 1 refills | Status: DC | PRN

## 2016-10-02 MED ORDER — HYDROCODONE-ACETAMINOPHEN 5-325 MG PO TABS: 1.0000 | ORAL_TABLET | ORAL | 0 refills | Status: DC | PRN

## 2016-10-02 MED ORDER — DOCUSATE SODIUM 100 MG PO CAPS
100.0000 mg | ORAL_CAPSULE | Freq: Two times a day (BID) | ORAL | Status: DC
  Administered 2016-10-02: 100 mg via ORAL
  Filled 2016-10-02: qty 1

## 2016-10-02 NOTE — Progress Notes (Signed)
Audrey Greer to be D/C'd  per MD order. Discussed with the patient and all questions fully answered.  VSS, Skin clean, dry and intact without evidence of skin break down, no evidence of skin tears noted.  IV catheter discontinued intact. Site without signs and symptoms of complications. Dressing and pressure applied.  An After Visit Summary was printed and given to the patient. Patient received prescription.  D/c education completed with patient/family including follow up instructions, medication list, d/c activities limitations if indicated, with other d/c instructions as indicated by MD - patient able to verbalize understanding, all questions fully answered.   Patient instructed to return to ED, call 911, or call MD for any changes in condition.   Patient to be escorted via WC, and D/C home via private auto.

## 2016-10-02 NOTE — Progress Notes (Signed)
1 Day Post-Op  Subjective: Still having a fair bit of pain.  Objective: Vital signs in last 24 hours: Temp:  [97.9 F (36.6 C)-98.4 F (36.9 C)] 97.9 F (36.6 C) (11/21 0624) Pulse Rate:  [76-106] 88 (11/21 0624) Resp:  [16-25] 19 (11/21 0624) BP: (104-143)/(60-79) 143/64 (11/21 0624) SpO2:  [92 %-100 %] 94 % (11/21 0624) Last BM Date: 09/30/16  Intake/Output from previous day: 11/20 0701 - 11/21 0700 In: 1810 [I.V.:1810] Out: 970 [Urine:950; Blood:20] Intake/Output this shift: No intake/output data recorded.  Resp: clear to auscultation bilaterally Cardio: regular rate and rhythm GI: soft, appropriately tender.  Lab Results:  No results for input(s): WBC, HGB, HCT, PLT in the last 72 hours. BMET No results for input(s): NA, K, CL, CO2, GLUCOSE, BUN, CREATININE, CALCIUM in the last 72 hours. PT/INR No results for input(s): LABPROT, INR in the last 72 hours. ABG No results for input(s): PHART, HCO3 in the last 72 hours.  Invalid input(s): PCO2, PO2  Studies/Results: No results found.  Anti-infectives: Anti-infectives    Start     Dose/Rate Route Frequency Ordered Stop   10/01/16 1000  ceFAZolin (ANCEF) 3 g in dextrose 5 % 50 mL IVPB     3 g 130 mL/hr over 30 Minutes Intravenous To [facility] 09/28/16 1153 10/01/16 1147      Assessment/Plan: s/p Procedure(s): LAPAROSCOPIC VENTRAL HERNIA WITH MESH (N/A) INSERTION OF MESH (N/A) Advance diet  Continue to work on pain control Not sure if she will be able to go home today  LOS: 0 days    TOTH III,Alaa Mullally S 10/02/2016

## 2016-10-24 DIAGNOSIS — F411 Generalized anxiety disorder: Secondary | ICD-10-CM | POA: Insufficient documentation

## 2016-11-21 ENCOUNTER — Encounter: Payer: Self-pay | Admitting: *Deleted

## 2016-11-21 ENCOUNTER — Emergency Department (INDEPENDENT_AMBULATORY_CARE_PROVIDER_SITE_OTHER)
Admission: EM | Admit: 2016-11-21 | Discharge: 2016-11-21 | Disposition: A | Discharge status: Home / Self Care | Payer: Managed Care, Other (non HMO) | Source: Home / Self Care | Attending: Family Medicine | Admitting: Family Medicine

## 2016-11-21 DIAGNOSIS — B9789 Other viral agents as the cause of diseases classified elsewhere: Secondary | ICD-10-CM

## 2016-11-21 DIAGNOSIS — J069 Acute upper respiratory infection, unspecified: Secondary | ICD-10-CM

## 2016-11-21 DIAGNOSIS — J9801 Acute bronchospasm: Secondary | ICD-10-CM

## 2016-11-21 DIAGNOSIS — Z8679 Personal history of other diseases of the circulatory system: Secondary | ICD-10-CM

## 2016-11-21 HISTORY — DX: Essential (primary) hypertension: I10

## 2016-11-21 MED ORDER — PREDNISONE 20 MG PO TABS: 20.0000 mg | ORAL_TABLET | Freq: Two times a day (BID) | ORAL | 0 refills | Status: DC

## 2016-11-21 MED ORDER — DOXYCYCLINE HYCLATE 100 MG PO CAPS: 100.0000 mg | ORAL_CAPSULE | Freq: Two times a day (BID) | ORAL | 0 refills | Status: DC

## 2016-11-21 MED ORDER — BENZONATATE 200 MG PO CAPS: ORAL_CAPSULE | ORAL | 0 refills | Status: DC

## 2016-11-21 MED ORDER — ALBUTEROL SULFATE HFA 108 (90 BASE) MCG/ACT IN AERS: 1.0000 | INHALATION_SPRAY | Freq: Four times a day (QID) | RESPIRATORY_TRACT | 0 refills | Status: DC | PRN

## 2016-11-21 NOTE — ED Provider Notes (Signed)
[facility]    CSN: [id number] Arrival date & time: 11/21/16  0820     History   Chief Complaint Chief Complaint  Patient presents with  . Cough    HPI Audrey Greer is a 31 y.o. female.   Patient reports that she developed a URI about 3 weeks ago and felt improved after about 10 days, but still had a non-productive cough.  Her cough has become worse, especially at night when she wheezes and feels shortness of breath.  No recent fever.  She has a history of asthma, and had numerous episodes of pneumonia as a child. She has had hypertension in the past, not presently taking medication.  She admits that she also has anxiety which contributes to her elevated BP.  She states that she will be visiting her PCP in one week.   The history is provided by the patient.    Past Medical History:  Diagnosis Date  . Anxiety   . Asthma   . Chronic bronchitis (HCC)   . Cystitis   . History of hypertension   . Hypertension   . Hypothyroidism   . Migraine    "a few/year" (10/01/2016)  . Pneumonia    "many many times; most recent was 11/2015" (10/01/2016)  . Polycystic ovarian syndrome     Patient Active Problem List   Diagnosis Date Noted  . Ventral hernia without obstruction or gangrene 10/01/2016  . Morbid obesity (HCC) 04/14/2015  . VBAC, delivered, current hospitalization 12/31/2011  . Third degree perineal laceration, delivered, current hospitalization 12/31/2011  . PCOS (polycystic ovarian syndrome) 10/09/2011  . Hypothyroidism 10/09/2011  . Asthma 10/09/2011  . History of HELLP syndrome, currently pregnant 10/09/2011    Past Surgical History:  Procedure Laterality Date  . CESAREAN SECTION  2007  . GANGLION CYST EXCISION Left ~ 2011  . HERNIA REPAIR    . INSERTION OF MESH N/A 10/01/2016   Procedure: INSERTION OF MESH;  Surgeon: Chevis PrettyPaul Toth III, MD;  Location: [facility] OR;  Service: General;  Laterality: N/A;  . LAPAROSCOPIC ASSISTED VENTRAL HERNIA REPAIR   10/01/2016   w'mesh  . LAPAROSCOPIC CHOLECYSTECTOMY  01/2007  . MIDDLE EAR SURGERY Right    "repaired hole in my eardrum from tube"  . TONSILLECTOMY AND ADENOIDECTOMY  1991  . TYMPANOSTOMY TUBE PLACEMENT Bilateral   . VENTRAL HERNIA REPAIR N/A 10/01/2016   Procedure: LAPAROSCOPIC VENTRAL HERNIA WITH MESH;  Surgeon: Chevis PrettyPaul Toth III, MD;  Location: [facility] OR;  Service: General;  Laterality: N/A;  . WISDOM TOOTH EXTRACTION      OB History    Gravida Para Term Preterm AB Living   3 2 1 1 1 2    SAB TAB Ectopic Multiple Live Births   1 0 0 0 1       Home Medications    Prior to Admission medications   Medication Sig Start Date End Date Taking? Authorizing Provider  cholecalciferol (VITAMIN D) 1000 units tablet Take 1,000 Units by mouth daily.   Yes Historical Provider, MD  clonazePAM (KLONOPIN) 0.5 MG tablet Take 0.5 mg by mouth 2 (two) times daily as needed for anxiety.   Yes Historical Provider, MD  FLUoxetine (PROZAC) 10 MG tablet Take 10 mg by mouth daily.   Yes Historical Provider, MD  levothyroxine (SYNTHROID, LEVOTHROID) 137 MCG tablet Take 137 mcg by mouth daily before breakfast.  08/22/16  Yes Historical Provider, MD  albuterol (PROVENTIL HFA;VENTOLIN HFA) 108 (90 Base) MCG/ACT inhaler Inhale 1-2 puffs into the  lungs every 6 (six) hours as needed for wheezing or shortness of breath. 11/21/16   Lattie Haw, MD  benzonatate (TESSALON) 200 MG capsule Take one cap by mouth at bedtime as needed for cough.  May repeat in 4 to 6 hours 11/21/16   Lattie Haw, MD  doxycycline (VIBRAMYCIN) 100 MG capsule Take 1 capsule (100 mg total) by mouth 2 (two) times daily. Take with food. 11/21/16   Lattie Haw, MD  ibuprofen (ADVIL,MOTRIN) 200 MG tablet Take 800 mg by mouth every 8 (eight) hours as needed (for pain.).    Historical Provider, MD  medroxyPROGESTERone (PROVERA) 10 MG tablet Take 1 tablet (10 mg total) by mouth daily. Use for ten days each month prn Patient taking differently: Take  10 mg by mouth as directed. Use for ten days each month as needed for menstruation 11/29/14   Allie Bossier, MD  predniSONE (DELTASONE) 20 MG tablet Take 1 tablet (20 mg total) by mouth 2 (two) times daily. Take with food. 11/21/16   Lattie Haw, MD    Family History Family History  Problem Relation Age of Onset  . Arthritis Father   . Hypertension Father   . Diabetes Father   . Hypertension Maternal Grandmother   . Diabetes Maternal Grandmother   . Diabetes Paternal Grandmother   . Hypertension Paternal Grandmother   . Heart disease Paternal Grandmother   . Cancer Paternal Grandfather   . Hypertension Mother   . Anesthesia problems Neg Hx     Social History Social History  Substance Use Topics  . Smoking status: Never Smoker  . Smokeless tobacco: Never Used  . Alcohol use 0.0 oz/week     Comment: 10/01/2016 "might have 1 drink/month, if that"     Allergies   No known allergies   Review of Systems Review of Systems  No sore throat + cough No pleuritic pain + wheezing + nasal congestion ? post-nasal drainage No sinus pain/pressure No itchy/red eyes No earache No hemoptysis + SOB No fever/chills No nausea No vomiting No abdominal pain No diarrhea No urinary symptoms No skin rash + fatigue No myalgias No headache Used OTC meds without relief    Physical Exam Triage Vital Signs ED Triage Vitals  Enc Vitals Group     BP 11/21/16 0835 (!) 166/145     Pulse Rate 11/21/16 0835 80     Resp 11/21/16 0835 18     Temp 11/21/16 0835 98.3 F (36.8 C)     Temp Source 11/21/16 0835 Oral     SpO2 11/21/16 0835 99 %     Weight 11/21/16 0836 274 lb (124.3 kg)     Height 11/21/16 0836 5\' 4"  (1.626 m)     Head Circumference --      Peak Flow --      Pain Score 11/21/16 0840 0     Pain Loc --      Pain Edu? --      Excl. in GC? --    No data found.   Updated Vital Signs BP 129/87 (BP Location: Left Arm)   Pulse 80   Temp 98.3 F (36.8 C) (Oral)   Resp  18   Ht 5\' 4"  (1.626 m)   Wt 274 lb (124.3 kg)   LMP 11/08/2016   SpO2 99%   BMI 47.03 kg/m   Visual Acuity Right Eye Distance:   Left Eye Distance:   Bilateral Distance:    Right Eye Near:  Left Eye Near:    Bilateral Near:     Physical Exam Nursing notes and Vital Signs reviewed. Appearance:  Patient appears stated age, and in no acute distress Eyes:  Pupils are equal, round, and reactive to light and accomodation.  Extraocular movement is intact.  Conjunctivae are not inflamed  Ears:  Canals normal.  Tympanic membranes normal.  Nose:  Mildly congested turbinates.  No sinus tenderness.    Pharynx:  Normal Neck:  Supple.  Tender enlarged posterior/lateral nodes are palpated bilaterally  Lungs:  Clear to auscultation.  Breath sounds are equal.  Moving air well. Chest:  Distinct tenderness to palpation over the mid-sternum.  Heart:  Regular rate and rhythm without murmurs, rubs, or gallops.  Abdomen:  Nontender without masses or hepatosplenomegaly.  Bowel sounds are present.  No CVA or flank tenderness.  Extremities:  No edema.  Skin:  No rash present.    UC Treatments / Results  Labs (all labs ordered are listed, but only abnormal results are displayed) Labs Reviewed - No data to display  EKG  EKG Interpretation None       Radiology No results found.  Procedures Procedures (including critical care time)  Medications Ordered in UC Medications - No data to display   Initial Impression / Assessment and Plan / UC Course  I have reviewed the triage vital signs and the nursing notes.  Pertinent labs & imaging results that were available during my care of the patient were reviewed by me and considered in my medical decision making (see chart for details).  Clinical Course   Patient's persistent cough is probably post-infectious with bronchospasm; however, with her past history of pneumonia will begin empiric doxycycline. Begin brief prednisone  burst. Prescription written for Benzonatate (Tessalon) to take at bedtime for night-time cough.  Refill albuterol inhaler. Take plain guaifenesin (1200mg  extended release tabs such as Mucinex) twice daily, with plenty of water, for cough and congestion.  Get adequate rest.   May use Afrin nasal spray (or generic oxymetazoline) twice daily for about 5 days and then discontinue.  Also recommend using saline nasal spray several times daily and saline nasal irrigation (AYR is a common brand).  Use Flonase nasal spray each morning after using Afrin nasal spray and saline nasal irrigation. Try warm salt water gargles for sore throat.  Stop decongestants and all antihistamines for now, and other non-prescription cough/cold preparations. Continue albuterol inhaler as needed. Monitor blood pressure more frequently at different times of day and record on a calendar.  Followup with Family Doctor in approximately one week as scheduled.     Final Clinical Impressions(s) / UC Diagnoses   Final diagnoses:  Viral URI with cough  Bronchospasm  History of high blood pressure    New Prescriptions New Prescriptions   BENZONATATE (TESSALON) 200 MG CAPSULE    Take one cap by mouth at bedtime as needed for cough.  May repeat in 4 to 6 hours   DOXYCYCLINE (VIBRAMYCIN) 100 MG CAPSULE    Take 1 capsule (100 mg total) by mouth 2 (two) times daily. Take with food.   PREDNISONE (DELTASONE) 20 MG TABLET    Take 1 tablet (20 mg total) by mouth 2 (two) times daily. Take with food.     Lattie Haw, MD 12/07/16 [phone]

## 2016-11-21 NOTE — Discharge Instructions (Signed)
Take plain guaifenesin (1200mg  extended release tabs such as Mucinex) twice daily, with plenty of water, for cough and congestion.  Get adequate rest.   May use Afrin nasal spray (or generic oxymetazoline) twice daily for about 5 days and then discontinue.  Also recommend using saline nasal spray several times daily and saline nasal irrigation (AYR is a common brand).  Use Flonase nasal spray each morning after using Afrin nasal spray and saline nasal irrigation. Try warm salt water gargles for sore throat.  Stop decongestants and all antihistamines for now, and other non-prescription cough/cold preparations. Continue albuterol inhaler as needed. Monitor blood pressure more frequently at different times of day and record on a calendar.

## 2016-11-21 NOTE — ED Triage Notes (Signed)
Pt c/o lingering nonproductive cough post URI that began on 11/05/16. Denies recent fever. She has taken Dayquil and IBF.

## 2017-02-07 LAB — BASIC METABOLIC PANEL: GLUCOSE: 77

## 2017-02-07 LAB — LIPID PANEL
CHOLESTEROL: 116 (ref 0–200)
HDL: 48 (ref 35–70)
TRIGLYCERIDES: 96 (ref 40–160)

## 2017-02-07 LAB — HEMOGLOBIN A1C: HEMOGLOBIN A1C: 4.9

## 2017-03-21 DIAGNOSIS — E041 Nontoxic single thyroid nodule: Secondary | ICD-10-CM | POA: Insufficient documentation

## 2017-06-07 ENCOUNTER — Encounter: Payer: Self-pay | Admitting: Osteopathic Medicine

## 2017-06-07 ENCOUNTER — Ambulatory Visit (INDEPENDENT_AMBULATORY_CARE_PROVIDER_SITE_OTHER): Payer: Managed Care, Other (non HMO) | Admitting: Osteopathic Medicine

## 2017-06-07 VITALS — BP 147/85 | HR 82 | Ht 64.0 in | Wt 274.0 lb

## 2017-06-07 DIAGNOSIS — F410 Panic disorder [episodic paroxysmal anxiety] without agoraphobia: Secondary | ICD-10-CM | POA: Insufficient documentation

## 2017-06-07 DIAGNOSIS — F411 Generalized anxiety disorder: Secondary | ICD-10-CM | POA: Diagnosis not present

## 2017-06-07 DIAGNOSIS — J019 Acute sinusitis, unspecified: Secondary | ICD-10-CM | POA: Diagnosis not present

## 2017-06-07 MED ORDER — AMOXICILLIN-POT CLAVULANATE 875-125 MG PO TABS: 1.0000 | ORAL_TABLET | Freq: Two times a day (BID) | ORAL | 0 refills | Status: DC

## 2017-06-07 MED ORDER — IPRATROPIUM BROMIDE 0.03 % NA SOLN: 2.0000 | Freq: Four times a day (QID) | NASAL | 0 refills | Status: DC

## 2017-06-07 NOTE — Progress Notes (Signed)
HPI: Jama FlavorsLauren E Dengel is a 31 y.o. female  who [facility],  for chief complaint of:  Chief Complaint  Patient presents with  . Establish Care  . Hashimoto's Thyroiditis  . Sore Throat  . sinus pressure    Sore throat and Sinus pressure x1 month. Started with cold type symptoms that just didn't seem to get any better. Occasional cough but mostly this has resolved. She notices significant sinus pressure and sometimes ear pressure on the right side, particularly with leaning forward  Anxiety: Klonopin as needed. Uses this maybe a few times per month. Is nervous about being on antianxiety medications. Recognizes risk of dependence and is compliant with sparing use of Klonopin. Has been on multiple other antianxiety/antidepressant medications in the past which didn't really work for her or caused side effects.   Past medical, surgical, social and family history reviewed: Patient Active Problem List   Diagnosis Date Noted  . Ventral hernia without obstruction or gangrene 10/01/2016  . Morbid obesity (HCC) 04/14/2015  . VBAC, delivered, current hospitalization 12/31/2011  . Third degree perineal laceration, delivered, current hospitalization 12/31/2011  . PCOS (polycystic ovarian syndrome) 10/09/2011  . Hypothyroidism 10/09/2011  . Asthma 10/09/2011  . History of HELLP syndrome, currently pregnant 10/09/2011   Past Surgical History:  Procedure Laterality Date  . CESAREAN SECTION  2007  . GANGLION CYST EXCISION Left ~ 2011  . HERNIA REPAIR    . INSERTION OF MESH N/A 10/01/2016   Procedure: INSERTION OF MESH;  Surgeon: Chevis PrettyPaul Toth III, MD;  Location: [facility] OR;  Service: General;  Laterality: N/A;  . LAPAROSCOPIC ASSISTED VENTRAL HERNIA REPAIR  10/01/2016   w'mesh  . LAPAROSCOPIC CHOLECYSTECTOMY  01/2007  . MIDDLE EAR SURGERY Right    "repaired hole in my eardrum from tube"  . TONSILLECTOMY AND ADENOIDECTOMY  1991  .  TYMPANOSTOMY TUBE PLACEMENT Bilateral   . VENTRAL HERNIA REPAIR N/A 10/01/2016   Procedure: LAPAROSCOPIC VENTRAL HERNIA WITH MESH;  Surgeon: Chevis PrettyPaul Toth III, MD;  Location: [facility] OR;  Service: General;  Laterality: N/A;  . WISDOM TOOTH EXTRACTION     Social History  Substance Use Topics  . Smoking status: Never Smoker  . Smokeless tobacco: Never Used  . Alcohol use 0.0 oz/week     Comment: 10/01/2016 "might have 1 drink/month, if that"   Family History  Problem Relation Age of Onset  . Arthritis Father   . Hypertension Father   . Diabetes Father   . Hypertension Maternal Grandmother   . Diabetes Maternal Grandmother   . Cancer Maternal Grandmother   . Thyroid disease Maternal Grandmother   . Diabetes Paternal Grandmother   . Hypertension Paternal Grandmother   . Heart disease Paternal Grandmother   . Cancer Paternal Grandfather   . Hypertension Paternal Grandfather   . Hypertension Mother   . Alcohol abuse Mother   . Alcohol abuse Sister   . Diabetes Paternal Aunt   . Alcohol abuse Maternal Grandfather   . Hypertension Maternal Grandfather   . Anesthesia problems Neg Hx      Current medication list and allergy/intolerance information reviewed:   Current Outpatient Prescriptions  Medication Sig Dispense Refill  . clonazePAM (KLONOPIN) 0.5 MG tablet Take 0.5 mg by mouth 2 (two) times daily as needed for anxiety.    Marland Kitchen. levothyroxine (SYNTHROID, LEVOTHROID) 137 MCG tablet Take 137 mcg by mouth daily before breakfast.   0   No current facility-administered medications for this  visit.    Allergies  Allergen Reactions  . No Known Allergies       Review of Systems:  Constitutional:  No  fever, no chills, No recent illness, No unintentional weight changes. No significant fatigue.   HEENT: No  headache, no vision change, no hearing change, +sore throat, +sinus pressure  Cardiac: No  chest pain, No  pressure, No palpitations  Respiratory:  No  shortness of breath. +occasional  Cough  Gastrointestinal: No  abdominal pain, No  nausea, No  vomiting,  No  blood in stool, No  diarrhea, No  constipation   Musculoskeletal: No new myalgia/arthralgia  Genitourinary: No  incontinence  Skin: No  Rash, No other wounds/concerning lesions  Endocrine: No cold intolerance,  No heat intolerance.   Neurologic: No  weakness, No  dizziness  Psychiatric: No  concerns with depression, +concerns with anxiety, No sleep problems, No mood problems  Exam:  BP (!) 147/85   Pulse 82   Ht 5\' 4"  (1.626 m)   Wt 274 lb (124.3 kg)   LMP 05/26/2017   BMI 47.03 kg/m   Constitutional: VS see above. General Appearance: alert, well-developed, well-nourished, NAD  Eyes: Normal lids and conjunctive, non-icteric sclera  Ears, Nose, Mouth, Throat: MMM, Normal external inspection ears/nares/mouth/lips/gums. TM normal bilaterally. Pharynx/tonsils no erythema, no exudate. Nasal mucosa normal.    Neck: No masses, trachea midline. No thyroid enlargement. No tenderness/mass appreciated. No lymphadenopathy  Respiratory: Normal respiratory effort. no wheeze, no rhonchi, no rales  Cardiovascular: S1/S2 normal, no murmur, no rub/gallop auscultated. RRR. No lower extremity edema..   Musculoskeletal: Gait normal.   Neurological: Normal balance/coordination. No tremor.   Skin: warm, dry, intact. No rash/ulcer.   Psychiatric: Normal judgment/insight. Normal mood and affect. Oriented x3.   Recent labs reviewed: No major concerns. Patient reports recent A1c was not in prediabetic range, has biometric screening labs with her today and everything looks fine. CT scan documents.  ASSESSMENT/PLAN:   Subacute sinusitis, unspecified location - Plan: amoxicillin-clavulanate (AUGMENTIN) 875-125 MG tablet, ipratropium (ATROVENT) 0.03 % nasal spray  GAD (generalized anxiety disorder) - Okay to refill Klonopin for sparing use, contact the office when she is in need of refills.      Visit summary with  medication list and pertinent instructions was printed for patient to review. All questions at time of visit were answered - patient instructed to contact office with any additional concerns. ER/RTC precautions were reviewed with the patient. Follow-up plan: Return in about 6 months (around 12/08/2017) for annual physical, sooner if needed! Molli Knock. Okay to refill clonazepam until that time but no refills authorized after January

## 2017-07-17 ENCOUNTER — Encounter: Payer: Self-pay | Admitting: Osteopathic Medicine

## 2017-07-24 ENCOUNTER — Encounter: Payer: Self-pay | Admitting: Osteopathic Medicine

## 2017-07-24 ENCOUNTER — Telehealth: Payer: Self-pay | Admitting: Osteopathic Medicine

## 2017-07-24 ENCOUNTER — Ambulatory Visit (INDEPENDENT_AMBULATORY_CARE_PROVIDER_SITE_OTHER): Payer: Managed Care, Other (non HMO)

## 2017-07-24 ENCOUNTER — Ambulatory Visit (INDEPENDENT_AMBULATORY_CARE_PROVIDER_SITE_OTHER): Payer: Managed Care, Other (non HMO) | Admitting: Osteopathic Medicine

## 2017-07-24 VITALS — BP 129/87 | HR 72 | Ht 64.0 in | Wt 277.0 lb

## 2017-07-24 DIAGNOSIS — R1031 Right lower quadrant pain: Secondary | ICD-10-CM

## 2017-07-24 DIAGNOSIS — J019 Acute sinusitis, unspecified: Secondary | ICD-10-CM

## 2017-07-24 DIAGNOSIS — M25551 Pain in right hip: Secondary | ICD-10-CM

## 2017-07-24 MED ORDER — FLUTICASONE PROPIONATE 50 MCG/ACT NA SUSP
2.0000 | Freq: Every day | NASAL | 1 refills | Status: AC
Start: 2017-07-24 — End: 2018-07-24

## 2017-07-24 MED ORDER — AMOXICILLIN-POT CLAVULANATE 875-125 MG PO TABS: 1.0000 | ORAL_TABLET | Freq: Two times a day (BID) | ORAL | 0 refills | Status: DC

## 2017-07-24 MED ORDER — CLONAZEPAM 0.5 MG PO TABS: 0.2500 mg | ORAL_TABLET | Freq: Two times a day (BID) | ORAL | 0 refills | Status: DC | PRN

## 2017-07-24 NOTE — Telephone Encounter (Signed)
Pt was seen today.  She forgot to mention that she needs a refill on Klonopin.  Thank you.

## 2017-07-24 NOTE — Progress Notes (Signed)
HPI: Audrey Greer is a 31 y.o. female  who presents to [name], 07/24/17,  for chief complaint of:  Chief Complaint  Patient presents with  . Flank Pain    RIGHT SIDE     . Context: Hx hernia repair, ovarian cyst, hip pain on r . Location: R lower abdomen  . Quality: sharp/cramping . Duration/Timing: intermittent typically for about a day, happens maybe every few weeks, not sure if related to cycles . Modifying factors: nothing helps. Made worse by twisting her torso, leaning forward, lying down on R side  . Assoc signs/symptoms: no change bowel movements, no change in vaginal bleeding/discharge, no nausea/vomiting, +hip pain on R. Anxiety about illness.    ER records 05/14/17: R flank pain, new. RUQ tender on exam. BP was elevated 154/91. CBC and CMP ok. UA ok. CT abdomen +fatty liver, +umbilical hernia repair. Likely benign R adnexal cyst, no acute findings. Rx Zofran and did not follow-up.   Also reports sinus problems are back, she is taking Claritin but severe congestion for about a week.     Past medical, surgical, social and family history reviewed: Patient Active Problem List   Diagnosis Date Noted  . Panic attack 06/07/2017  . Right thyroid nodule 03/21/2017  . GAD (generalized anxiety disorder) 10/24/2016  . Ventral hernia without obstruction or gangrene 10/01/2016  . Morbid obesity (HCC) 04/14/2015  . History of irregular menstrual cycles 05/27/2014  . VBAC, delivered, current hospitalization 12/31/2011  . Third degree perineal laceration, delivered, current hospitalization 12/31/2011  . PCOS (polycystic ovarian syndrome) 10/09/2011  . Hypothyroidism 10/09/2011  . Asthma 10/09/2011  . 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 CESAREAN SECTION  2007  . GANGLION CYST EXCISION Left ~ 2011  . HERNIA REPAIR    .  INSERTION OF MESH N/A 10/01/2016   Procedure: INSERTION OF MESH;  Surgeon: Chevis Pretty III, MD;  Location: [facility];  Service: General;  Laterality: N/A;  . LAPAROSCOPIC ASSISTED VENTRAL HERNIA REPAIR  10/01/2016   w'mesh  . LAPAROSCOPIC CHOLECYSTECTOMY  01/2007  . MIDDLE EAR SURGERY Right    "repaired hole in my eardrum from tube"  . TONSILLECTOMY AND ADENOIDECTOMY  1991  . TYMPANOSTOMY TUBE PLACEMENT Bilateral   . VENTRAL HERNIA REPAIR N/A 10/01/2016   Procedure: LAPAROSCOPIC VENTRAL HERNIA WITH MESH;  Surgeon: Chevis Pretty III, MD;  Location: [facility] OR;  Service: General;  Laterality: N/A;  . WISDOM TOOTH EXTRACTION     Social History  Substance Use Topics  . Smoking status: Never Smoker  . Smokeless tobacco: Never Used  . Alcohol use 0.0 oz/week     Comment: 10/01/2016 "might have 1 drink/month, if that"   Family History  Problem Relation Age of Onset  . Arthritis Father   . Hypertension Father   . Diabetes Father   . Hypertension Maternal Grandmother   . Diabetes Maternal Grandmother   . Cancer Maternal Grandmother   . Thyroid disease Maternal Grandmother   . Diabetes Paternal Grandmother   . Hypertension Paternal Grandmother   . Heart disease Paternal Grandmother   . Cancer Paternal Grandfather   . Hypertension Paternal Grandfather   . Hypertension Mother   . Alcohol abuse Mother   . Alcohol abuse Sister   . Diabetes Paternal Aunt   . Alcohol abuse Maternal Grandfather   . Hypertension Maternal Grandfather   . Anesthesia  problems Neg Hx      Current medication list and allergy/intolerance information reviewed:   Current Outpatient Prescriptions  Medication Sig Dispense Refill  . amoxicillin-clavulanate (AUGMENTIN) 875-125 MG tablet Take 1 tablet by mouth 2 (two) times daily. For one week 14 tablet 0  . clonazePAM (KLONOPIN) 0.5 MG tablet Take 0.5 mg by mouth 2 (two) times daily as needed for anxiety.    Marland Kitchen. ipratropium (ATROVENT) 0.03 % nasal spray Place 2 sprays into both  nostrils 4 (four) times daily. 30 mL 0  . levothyroxine (SYNTHROID, LEVOTHROID) 137 MCG tablet Take 137 mcg by mouth daily before breakfast.   0   No current facility-administered medications for this visit.    Allergies  Allergen Reactions  . No Known Allergies       Review of Systems:  Constitutional:  No  fever, no chills, No recent illness, No unintentional weight changes. No significant fatigue.   HEENT: +sinus headache, no vision change, no hearing change, No sore throat, +sinus pressure  Cardiac: No  chest pain, No  pressure, No palpitations  Respiratory:  No  shortness of breath. No  Cough  Gastrointestinal: +abdominal pain, No  nausea, No  vomiting,  No  blood in stool, No  diarrhea, No  constipation   Musculoskeletal: +myalgia/arthralgia  Genitourinary: No  incontinence, No  abnormal genital bleeding, No abnormal genital discharge  Skin: No  Rash, No other wounds/concerning lesions  Neurologic: No  weakness, +dizziness  Psychiatric: +concerns with depression, +concerns with anxiety  Exam:  BP 129/87   Pulse 72   Ht 5\' 4"  (1.626 m)   Wt 277 lb (125.6 kg)   BMI 47.55 kg/m   Constitutional: VS see above. General Appearance: alert, well-developed, well-nourished, NAD  Eyes: Normal lids and conjunctive, non-icteric sclera  Ears, Nose, Mouth, Throat: MMM, Normal external inspection ears/nares/mouth/lips/gums.   Neck: No masses, trachea midline. No thyroid enlargement.   Respiratory: Normal respiratory effort. no wheeze, no rhonchi, no rales  Cardiovascular: S1/S2 normal, no murmur, no rub/gallop auscultated. RRR. No lower extremity edema.   Gastrointestinal: Nontender, no masses. No hepatomegaly, no splenomegaly. No hernia appreciated. Bowel sounds normal. Rectal exam deferred.   Musculoskeletal: Gait normal. No clubbing/cyanosis of digits. Log roll negative, normal hip extension and flexion against resistance. Some R hip pain with FABER  Neurological:  Normal balance/coordination. No tremor. Cerebellar reflexes intact.   Skin: warm, dry, intact. No rash/ulcer.   Psychiatric: Normal judgment/insight. Anxious mood and affect. Oriented x3.    No results found.   ASSESSMENT/PLAN:   Right lower quadrant abdominal pain - Ddx: R hip pain, R ovarian cyst/mittelschmirz, adhesions from abdominal surgery. To sports to eval hip, consider OCP (declines), exploratory lap last resort  Pain of right hip joint - Plan: DG HIP UNILAT W OR W/O PELVIS 2-3 VIEWS RIGHT  Subacute sinusitis, unspecified location - Plan: amoxicillin-clavulanate (AUGMENTIN) 875-125 MG tablet    Patient Instructions  I would recommend follow-up with one of our sports medicine specialists (Dr Denyse Amassorey or Dr. Cherylann Parrhekkekandam aka Dr. Karie Schwalbe) for further evaluation in the next week.  0    Visit summary with medication list and pertinent instructions was printed for patient to review. All questions at time of visit were answered - patient instructed to contact office with any additional concerns. ER/RTC precautions were reviewed with the patient. Follow-up plan: Return for KEEP APPT FOR PHYSICAL, RECHECK PAIN WITH SPORTS MED .  Note: Total time spent 25 minutes, greater than 50% of the visit was  spent face-to-face counseling and coordinating care for the following: The primary encounter diagnosis was Right lower quadrant abdominal pain. Diagnoses of Pain of right hip joint and Subacute sinusitis, unspecified location were also pertinent to this visit.Marland Kitchen

## 2017-07-24 NOTE — Patient Instructions (Addendum)
I would recommend follow-up with one of our sports medicine specialists (Dr Denyse Amassorey or Dr. Cherylann Parrhekkekandam aka Dr. Karie Schwalbe) for further evaluation in the next week.  0

## 2017-07-24 NOTE — Telephone Encounter (Signed)
Medication was sent to pharmacy by provider. Taesha Goodell,CMA

## 2017-07-25 ENCOUNTER — Encounter: Payer: Managed Care, Other (non HMO) | Admitting: Family Medicine

## 2017-07-30 ENCOUNTER — Ambulatory Visit (INDEPENDENT_AMBULATORY_CARE_PROVIDER_SITE_OTHER): Payer: Managed Care, Other (non HMO) | Admitting: Family Medicine

## 2017-07-30 ENCOUNTER — Encounter: Payer: Self-pay | Admitting: Family Medicine

## 2017-07-30 VITALS — BP 118/79 | HR 73 | Wt 275.0 lb

## 2017-07-30 DIAGNOSIS — M7062 Trochanteric bursitis, left hip: Secondary | ICD-10-CM | POA: Diagnosis not present

## 2017-07-30 NOTE — Progress Notes (Signed)
   Subjective:    I'm seeing this patient as a consultation for:  Audrey Nielsen, DO   CC: Right Side pain  HPI: [place] notes pain the right lateral hip. This has been ongoing now for several years intermittently. She notes the pain is worse when she lies on her right side and is better with rest. She notes occasionally the pain will get worse with activity as well. She denies any fevers or chills vomiting or significant diarrhea. She's had some workup with her primary care provider and had a normal pelvic x-ray on September 12.  Past medical history, Surgical history, Family history not pertinant except as noted below, Social history, Allergies, and medications have been entered into the medical record, reviewed, and no changes needed.   Review of Systems: No headache, visual changes, nausea, vomiting, diarrhea, constipation, dizziness, abdominal pain, skin rash, fevers, chills, night sweats, weight loss, swollen lymph nodes, body aches, joint swelling, muscle aches, chest pain, shortness of breath, mood changes, visual or auditory hallucinations.   Objective:    Vitals:   07/30/17 0828  BP: 118/79  Pulse: 73   General: Obese,  in no acute distress.  Neuro/Psych: Alert and oriented x3, extra-ocular muscles intact, able to move all 4 extremities, sensation grossly intact. Skin: Warm and dry, no rashes noted.  Respiratory: Not using accessory muscles, speaking in full sentences, trachea midline.  Cardiovascular: Pulses palpable, no extremity edema. Abdomen: Does not appear distended. MSK:  L-spine nontender to spinal midline. Normal back motion. Right hip normal-appearing normal motion. Tender to palpation right greater trochanter. Hip abduction strength diminished 4/5 right compared to left. Normal gait.  Xray right lateral hip: CLINICAL DATA:  Six months of right hip pain with no known injury  EXAM: DG HIP (WITH OR WITHOUT PELVIS) 2-3V RIGHT  COMPARISON:  None in  PACs  FINDINGS: The bones are subjectively adequately mineralized. There is no acute fracture nor dislocation. There is no lytic nor blastic lesion. AP and lateral views of the right hip reveal preservation of the joint space. The articular surfaces of the femoral head and acetabulum remains smoothly rounded. The femoral neck, intertrochanteric, and subtrochanteric regions are normal.  IMPRESSION: There is no acute or significant chronic bony abnormality of the right hip.   Electronically Signed   By: David  [place] M.D.   On: 07/24/2017 10:49  No results found for this or any previous visit (from the past 24 hour(s)). No results found.  Impression and Recommendations:    Assessment and Plan: 31 y.o. female with Right lateral hip pain greater trochanteric bursitis likely. Discussed options. Plan for treatment with physical therapy heating pad and TENS unit. Continue prescription strength NSAIDs as needed. Return in 6 weeks for recheck and reevaluation..   Orders Placed This Encounter  Procedures  . Ambulatory referral to Physical Therapy    Referral Priority:   Routine    Referral Type:   Physical Medicine    Referral Reason:   Specialty Services Required    Requested Specialty:   Physical Therapy   No orders of the defined types were placed in this encounter.   Discussed warning signs or symptoms. Please see discharge instructions. Patient expresses understanding.

## 2017-07-30 NOTE — Patient Instructions (Signed)
Thank you for coming in today. Attend PT.  Do the home exercises.  Use a heating pad.  Recheck in 6 weeks.  Consider a TENS unit.   TENS UNIT: This is helpful for muscle pain and spasm.   Search and Purchase a TENS 7000 2nd edition at  www.tenspros.com or www.Amazon.com It should be less than $30.     TENS unit instructions: Do not shower or bathe with the unit on Turn the unit off before removing electrodes or batteries If the electrodes lose stickiness add a drop of water to the electrodes after they are disconnected from the unit and place on plastic sheet. If you continued to have difficulty, call the TENS unit company to purchase more electrodes. Do not apply lotion on the skin area prior to use. Make sure the skin is clean and dry as this will help prolong the life of the electrodes. After use, always check skin for unusual red areas, rash or other skin difficulties. If there are any skin problems, does not apply electrodes to the same area. Never remove the electrodes from the unit by pulling the wires. Do not use the TENS unit or electrodes other than as directed. Do not change electrode placement without consultating your therapist or physician. Keep 2 fingers with between each electrode. Wear time ratio is 2:1, on to off times.    For example on for 30 minutes off for 15 minutes and then on for 30 minutes off for 15 minutes    Hip Bursitis Hip bursitis is inflammation of a fluid-filled sac (bursa) in the hip joint. The bursa protects the bones in the hip joint from rubbing against each other. Hip bursitis can cause mild to moderate pain, and symptoms often come and go over time. What are the causes? This condition may be caused by:  Injury to the hip.  Overuse of the muscles that surround the hip joint.  Arthritis or gout.  Diabetes.  Thyroid disease.  Cold weather.  Infection.  In some cases, the cause may not be known. What are the signs or  symptoms? Symptoms of this condition may include:  Mild or moderate pain in the hip area. Pain may get worse with movement.  Tenderness and swelling of the hip, especially on the outer side of the hip.  Symptoms may come and go. If the bursa becomes infected, you may have the following symptoms:  Fever.  Red skin and a feeling of warmth in the hip area.  How is this diagnosed? This condition may be diagnosed based on:  A physical exam.  Your medical history.  X-rays.  Removal of fluid from your inflamed bursa for testing (biopsy).  You may be sent to a health care provider who specializes in bone diseases (orthopedist) or a provider who specializes in joint inflammation (rheumatologist). How is this treated? This condition is treated by resting, raising (elevating), and applying pressure(compression) to the injured area. In some cases, this may be enough to make your symptoms go away. Treatment may also include:  Crutches.  Antibiotic medicine.  Draining fluid out of the bursa to help relieve swelling.  Injecting medicine that helps to reduce inflammation (cortisone).  Follow these instructions at home: Medicines  Take over-the-counter and prescription medicines only as told by your health care provider.  Do not drive or operate heavy machinery while taking prescription pain medicine, or as told by your health care provider.  If you were prescribed an antibiotic, take it as told by  your health care provider. Do not stop taking the antibiotic even if you start to feel better. Activity  Return to your normal activities as told by your health care provider. Ask your health care provider what activities are safe for you.  Rest and protect your hip as much as possible until your pain and swelling get better. General instructions  Wear compression wraps only as told by your health care provider.  Elevate your hip above the level of your heart as much as you can without  pain. To do this, try putting a pillow under your hips while you lie down.  Do not use your hip to support your body weight until your health care provider says that you can. Use crutches as told by your health care provider.  Gently massage and stretch your injured area as often as is comfortable.  Keep all follow-up visits as told by your health care provider. This is important. How is this prevented?  Exercise regularly, as told by your health care provider.  Warm up and stretch before being active.  Cool down and stretch after being active.  If an activity irritates your hip or causes pain, avoid the activity as much as possible.  Avoid sitting down for long periods at a time. Contact a health care provider if:  You have a fever.  You develop new symptoms.  You have difficulty walking or doing everyday activities.  You have pain that gets worse or does not get better with medicine.  You develop red skin or a feeling of warmth in your hip area. Get help right away if:  You cannot move your hip.  You have severe pain. This information is not intended to replace advice given to you by your health care provider. Make sure you discuss any questions you have with your health care provider. Document Released: 04/20/2002 Document Revised: 04/05/2016 Document Reviewed: 05/31/2015 Elsevier Interactive Patient Education  [employer].

## 2017-08-01 ENCOUNTER — Encounter: Payer: Managed Care, Other (non HMO) | Admitting: Osteopathic Medicine

## 2017-08-08 ENCOUNTER — Encounter: Payer: Self-pay | Admitting: Osteopathic Medicine

## 2017-08-08 ENCOUNTER — Ambulatory Visit: Payer: Managed Care, Other (non HMO) | Admitting: Physical Therapy

## 2017-08-08 ENCOUNTER — Telehealth: Payer: Self-pay | Admitting: Osteopathic Medicine

## 2017-08-08 ENCOUNTER — Ambulatory Visit (INDEPENDENT_AMBULATORY_CARE_PROVIDER_SITE_OTHER): Payer: Managed Care, Other (non HMO) | Admitting: Osteopathic Medicine

## 2017-08-08 VITALS — BP 138/89 | HR 73 | Ht 64.0 in | Wt 272.0 lb

## 2017-08-08 DIAGNOSIS — F411 Generalized anxiety disorder: Secondary | ICD-10-CM | POA: Diagnosis not present

## 2017-08-08 DIAGNOSIS — N644 Mastodynia: Secondary | ICD-10-CM

## 2017-08-08 DIAGNOSIS — Z0000 Encounter for general adult medical examination without abnormal findings: Secondary | ICD-10-CM

## 2017-08-08 DIAGNOSIS — R002 Palpitations: Secondary | ICD-10-CM

## 2017-08-08 NOTE — Telephone Encounter (Signed)
-----   Message from Pixie Casino, [place] sent at 08/08/2017  2:38 PM EDT ----- Regarding: RE: need call pharmacy  According to instructions on Clonazepam, patient instructions stated to take 2 tablets daily. Please advise. Estelle June  ----- Message ----- From: Sunnie Nielsen, DO Sent: 08/08/2017   8:50 AM To: Pixie Casino, CMA Subject: need call pharmacy                             Please call pharmacy - pt states they only filled #10 of the clonazepam, Rx was written for #30.

## 2017-08-08 NOTE — Patient Instructions (Signed)
Plan:  Will request previous Pap results  If breast tenderness persists, we should consider getting imaging with mammogram/ultrasound. You can also try vitamin E over-the-counter, and some women notice improvement with birth control pills.  Labs today for routine screening - please let us know if you don't hear back about your results within one week. We encourage signing up for MyChart to see results as soon as possible

## 2017-08-08 NOTE — Telephone Encounter (Signed)
Then this must be from a different prescriber because the ones that I wrote were for 0.5 mg tablets take 1/2-1 tablet 2 times daily as needed but specific instructions for 30 tablets for 90 day supply to limit overuse. Can pharmacy confirm the prescriber on the prescription and question?

## 2017-08-08 NOTE — Progress Notes (Signed)
HPI: Audrey Greer is a 31 y.o. female  who presents to [name] today, 08/08/17,  for chief complaint of:  Chief Complaint  Patient presents with  . Annual Exam      Patient here for annual physical / wellness exam.  See preventive care reviewed as below.  Recent labs reviewed in detail with the patient.   Additional concerns today include:   Breast tenderness x1 month on L jut above nipple area, no mass, no FH breast cancer but mom has cysts   Past medical, surgical, social and family history reviewed: Patient Active Problem List   Diagnosis Date Noted  . Panic attack 06/07/2017  . Right thyroid nodule 03/21/2017  . GAD (generalized anxiety disorder) 10/24/2016  . Ventral hernia without obstruction or gangrene 10/01/2016  . Morbid obesity (HCC) 04/14/2015  . History of irregular menstrual cycles 05/27/2014  . VBAC, delivered, current hospitalization 12/31/2011  . Third degree perineal laceration, delivered, current hospitalization 12/31/2011  . PCOS (polycystic ovarian syndrome) 10/09/2011  . Hypothyroidism 10/09/2011  . Asthma 10/09/2011  . 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 CESAREAN SECTION  2007  . GANGLION CYST EXCISION Left ~ 2011  . HERNIA REPAIR    . INSERTION OF MESH N/A 10/01/2016   Procedure: INSERTION OF MESH;  Surgeon: Chevis Pretty III, MD;  Location: [facility] OR;  Service: General;  Laterality: N/A;  . LAPAROSCOPIC ASSISTED VENTRAL HERNIA REPAIR  10/01/2016   w'mesh  . LAPAROSCOPIC CHOLECYSTECTOMY  01/2007  . MIDDLE EAR SURGERY Right    "repaired hole in my eardrum from tube"  . TONSILLECTOMY AND ADENOIDECTOMY  1991  . TYMPANOSTOMY TUBE PLACEMENT Bilateral   . VENTRAL HERNIA REPAIR N/A 10/01/2016   Procedure: LAPAROSCOPIC VENTRAL HERNIA WITH MESH;  Surgeon: Chevis Pretty III, MD;  Location: [facility] OR;  Service: General;   Laterality: N/A;  . WISDOM TOOTH EXTRACTION     Social History  Substance Use Topics  . Smoking status: Never Smoker  . Smokeless tobacco: Never Used  . Alcohol use 0.0 oz/week     Comment: 10/01/2016 "might have 1 drink/month, if that"   Family History  Problem Relation Age of Onset  . Arthritis Father   . Hypertension Father   . Diabetes Father   . Hypertension Maternal Grandmother   . Diabetes Maternal Grandmother   . Cancer Maternal Grandmother   . Thyroid disease Maternal Grandmother   . Diabetes Paternal Grandmother   . Hypertension Paternal Grandmother   . Heart disease Paternal Grandmother   . Cancer Paternal Grandfather   . Hypertension Paternal Grandfather   . Hypertension Mother   . Alcohol abuse Mother   . Alcohol abuse Sister   . Diabetes Paternal Aunt   . Alcohol abuse Maternal Grandfather   . Hypertension Maternal Grandfather   . Anesthesia problems Neg Hx      Current medication list and allergy/intolerance information reviewed:   Current Outpatient Prescriptions  Medication Sig Dispense Refill  . clonazePAM (KLONOPIN) 0.5 MG tablet Take 0.5-1 tablets (0.25-0.5 mg total) by mouth 2 (two) times daily as needed for anxiety (Use sparingly to avoid dependence). #30 for 90(ninety) days 30 tablet 0  . fluticasone (FLONASE) 50 MCG/ACT nasal spray Place 2 sprays into both nostrils daily. 16 g 1  . levothyroxine (SYNTHROID, LEVOTHROID) 137 MCG tablet Take 137 mcg by mouth daily before breakfast.   0  No current facility-administered medications for this visit.    Allergies  Allergen Reactions  . No Known Allergies       Review of Systems:  Constitutional:  No  fever, no chills, No recent illness, No unintentional weight changes. No significant fatigue.   HEENT: No  headache, no vision change, no hearing change, No sore throat, No  sinus pressure  Cardiac: No  chest pain, No  pressure, +occasiona anxiety-related palpitations, No  Orthopnea  Respiratory:   No  shortness of breath. No  Cough  Gastrointestinal: No  abdominal pain, No  nausea, No  vomiting,  No  blood in stool, No  diarrhea, No  constipation   Musculoskeletal: No new myalgia/arthralgia  Genitourinary: No  incontinence, No  abnormal genital bleeding, No abnormal genital discharge  Skin: No  Rash, No other wounds/concerning lesions  Hem/Onc: No  easy bruising/bleeding, No  abnormal lymph node  Endocrine: No cold intolerance,  No heat intolerance.  Neurologic: No  weakness, No  dizziness, No  slurred speech/focal weakness/facial droop  Psychiatric: No  concerns with depression, +concerns with anxiety, No sleep problems, No mood problems  Exam:  BP 138/89   Pulse 73   Ht  (1.626 m)   Wt 272 lb (123.4 kg)   BMI 46.69 kg/m   Constitutional: VS see above. General Appearance: alert, well-developed, well-nourished, NAD  Eyes: Normal lids and conjunctive, non-icteric sclera  Ears, Nose, Mouth, Throat: MMM, Normal external inspection ears/nares/mouth/lips/gums. TM normal bilaterally. Pharynx/tonsils no erythema, no exudate. Nasal mucosa normal.   Neck: No masses, trachea midline. No thyroid enlargement. No tenderness/mass appreciated. No lymphadenopathy  Respiratory: Normal respiratory effort. no wheeze, no rhonchi, no rales  Cardiovascular: S1/S2 normal, no murmur, no rub/gallop auscultated. RRR. No lower extremity edema. Pedal pulse II/IV bilaterally DP and PT. No carotid bruit or JVD. No abdominal aortic bruit.  Gastrointestinal: Nontender, no masses. No hepatomegaly, no splenomegaly. No hernia appreciated. Bowel sounds normal. Rectal exam deferred.   Musculoskeletal: Gait normal. No clubbing/cyanosis of digits.   Neurological: Normal balance/coordination. No tremor. No cranial nerve deficit on limited exam. Motor and sensation intact and symmetric. Cerebellar reflexes intact.   Skin: warm, dry, intact. No rash/ulcer. No concerning nevi or subq nodules on limited  exam.    Psychiatric: Normal judgment/insight. Normal mood and affect. Oriented x3.   BREAST: No rashes/skin changes, normal fibrous breast tissue, no masses or tenderness, normal nipple without discharge, normal axilla    ASSESSMENT/PLAN:   Annual physical exam - Plan: CBC, COMPLETE METABOLIC PANEL WITH GFR, Lipid panel, VITAMIN D 25 Hydroxy (Vit-D Deficiency, Fractures), Magnesium, CANCELED: CBC, CANCELED: COMPLETE METABOLIC PANEL WITH GFR, CANCELED: Lipid panel, CANCELED: TSH, CANCELED: VITAMIN D 25 Hydroxy (Vit-D Deficiency, Fractures)  GAD (generalized anxiety disorder) - Follow-up for controlled substance refill in 6 months  Breast tenderness in female - Declines imaging at this point, advised that if pain persists over the next few weeks we should get mammogram/ultrasound  Intermittent palpitations - Declines further workup today, thinks mostly anxiety related. Advised if continues to bother her or gets worse, come back for EKG and further workup - Plan: CBC, COMPLETE METABOLIC PANEL WITH GFR, Magnesium   FEMALE PREVENTIVE CARE Updated 08/08/17   ANNUAL SCREENING/COUNSELING  Diet/Exercise - HEALTHY HABITS DISCUSSED TO DECREASE CV RISK History  Smoking Status  . Never Smoker  Smokeless Tobacco  . Never Used   History  Alcohol Use  . 0.0 oz/week    Comment: 10/01/2016 "might have 1 drink/month, if  that"   Depression screen PHQ 2/9 07/24/2017  Decreased Interest 2  Down, Depressed, Hopeless 2  PHQ - 2 Score 4  Altered sleeping 3  Tired, decreased energy 3  Change in appetite 2  Feeling bad or failure about yourself  2  Trouble concentrating 3  Moving slowly or fidgety/restless 1  Suicidal thoughts 0  PHQ-9 Score 18    Domestic violence concerns - no  HTN SCREENING - SEE VITALS  SEXUAL HEALTH  Sexually active in the past year - Yes with female.  Need/want STI testing today? - no  Concerns about libido or pain with sex? - no  Plans for pregnancy? - using  condoms   INFECTIOUS DISEASE SCREENING  HIV - does not need  GC/CT - does not need  HepC - DOB [birth year] - does not need  TB - does not need  DISEASE SCREENING  Lipid - needs  DM2 - needs  Osteoporosis - women age 31+ - does not need  CANCER SCREENING  Cervical - Declines today, has upcoming weather with OB/GYN next month.  Breast - does not need  Lung - does not need  Colon - does not need  ADULT VACCINATION  Influenza - annual vaccine recommended  Td - booster every 10 years   Zoster - optio We'll request previous Pap recordsn at 50, yes at 60+   PCV13 - was not indicated  PPSV23 - was not indicated Immunization History  Administered Date(s) Administered  . Tdap 01/01/2012      Patient Instructions  Plan:  Will request previous Pap results  If breast tenderness persists, we should consider getting imaging with mammogram/ultrasound. You can also try vitamin E over-the-counter, and some women notice improvement with birth control pills.  Labs today for routine screening - please let us know if you don't hear back about your results within one week. We encourage signing up for MyChart to see results as soon as possible    Visit summary with medication list and pertinent instructions was printed for patient to review. All questions at time of visit were answered - patient instructed to contact office with any additional concerns. ER/RTC precautions were reviewed with the patient. Follow-up plan: Return in about 6 months (around 02/05/2018) for refill anxiety meds, see me in 1 year for annual checkup andlabs - sooner if needed .

## 2017-08-09 NOTE — Telephone Encounter (Signed)
Called pharmacy on file, the only Rx they had for clonazepam was written by Dr. Lyn Hollingshead. I attempted to look up Pt in the controlled substance database, the system is down. The pharmacy also could not load the database.

## 2017-08-10 LAB — COMPLETE METABOLIC PANEL WITH GFR
AG Ratio: 1.8 (calc) (ref 1.0–2.5)
ALBUMIN MSPROF: 4.4 g/dL (ref 3.6–5.1)
ALT: 16 U/L (ref 6–29)
AST: 12 U/L (ref 10–30)
Alkaline phosphatase (APISO): 64 U/L (ref 33–115)
BUN: 9 mg/dL (ref 7–25)
CALCIUM: 9.2 mg/dL (ref 8.6–10.2)
CO2: 26 mmol/L (ref 20–32)
CREATININE: 0.69 mg/dL (ref 0.50–1.10)
Chloride: 103 mmol/L (ref 98–110)
GFR, EST AFRICAN AMERICAN: 134 mL/min/{1.73_m2} (ref >=60)
GFR, EST NON AFRICAN AMERICAN: 116 mL/min/{1.73_m2} (ref >=60)
GLOBULIN: 2.5 g/dL (ref 1.9–3.7)
Glucose, Bld: 91 mg/dL (ref 65–99)
Potassium: 4.1 mmol/L (ref 3.5–5.3)
SODIUM: 138 mmol/L (ref 135–146)
TOTAL PROTEIN: 6.9 g/dL (ref 6.1–8.1)
Total Bilirubin: 0.6 mg/dL (ref 0.2–1.2)

## 2017-08-10 LAB — CBC
HEMATOCRIT: 36 % (ref 35.0–45.0)
HEMOGLOBIN: 12.2 g/dL (ref 11.7–15.5)
MCH: 28.7 pg (ref 27.0–33.0)
MCHC: 33.9 g/dL (ref 32.0–36.0)
MCV: 84.7 fL (ref 80.0–100.0)
MPV: 11.5 fL (ref 7.5–12.5)
Platelets: 275 10*3/uL (ref 140–400)
RBC: 4.25 10*6/uL (ref 3.80–5.10)
RDW: 13.2 % (ref 11.0–15.0)
WBC: 7.9 10*3/uL (ref 3.8–10.8)

## 2017-08-10 LAB — LIPID PANEL
CHOL/HDL RATIO: 3 (calc) (ref <5.0)
Cholesterol: 127 mg/dL (ref <200)
HDL: 42 mg/dL — AB (ref >50)
LDL Cholesterol (Calc): 64 mg/dL (calc)
NON-HDL CHOLESTEROL (CALC): 85 mg/dL (ref <130)
TRIGLYCERIDES: 127 mg/dL (ref <150)

## 2017-08-10 LAB — VITAMIN D 25 HYDROXY (VIT D DEFICIENCY, FRACTURES): Vit D, 25-Hydroxy: 33 ng/mL (ref 30–100)

## 2017-08-10 LAB — MAGNESIUM: Magnesium: 1.8 mg/dL (ref 1.5–2.5)

## 2017-08-12 NOTE — Telephone Encounter (Signed)
See lab result note - patient informed

## 2017-08-13 ENCOUNTER — Ambulatory Visit (INDEPENDENT_AMBULATORY_CARE_PROVIDER_SITE_OTHER): Payer: Managed Care, Other (non HMO) | Admitting: Physical Therapy

## 2017-08-13 ENCOUNTER — Encounter: Payer: Self-pay | Admitting: Physical Therapy

## 2017-08-13 DIAGNOSIS — M25551 Pain in right hip: Secondary | ICD-10-CM

## 2017-08-13 DIAGNOSIS — R29898 Other symptoms and signs involving the musculoskeletal system: Secondary | ICD-10-CM

## 2017-08-13 DIAGNOSIS — M6281 Muscle weakness (generalized): Secondary | ICD-10-CM

## 2017-08-13 NOTE — Patient Instructions (Addendum)
Pelvic Press    Place hands under belly between navel and pubic bone, palms up. Feel pressure on hands. Increase pressure on hands by pressing pelvis down. This is NOT a pelvic tilt. Hold _5__ seconds. Relax. Repeat _10__ times. Do 1 session per day  Isometric Abdominal    Lying on back with knees bent, tighten stomach by pressing elbows down. Hold __5__ seconds. Repeat __10__ times per set. Do __1__ sets per session. Do __1__ sessions per day.   Knee-to-Chest Stretch: Unilateral    With hand behind right knee, pull knee in to chest until a comfortable stretch is felt in lower back and buttocks. Keep back relaxed. Hold _30___ seconds. Repeat __1__ times per set. Do __1__ sets per session. Do __1__ sessions per day.  Lumbar Rotation (Non-Weight Bearing)    Feet on floor, slowly rock knees from side to side in small, pain-free range of motion. Allow lower back to rotate slightly. Hold 30 sec. Repeat _1___ times per set. Do __1__ sets per session. Do _1_ sessions per day.  Outer Hip Stretch: Reclined IT Band Stretch (Strap)    Strap around opposite foot, pull across only as far as possible with shoulders on mat. Hold for _30__secs. Repeat __1__ times each leg. Once a day.   Hip Flexor Stretch: Proposal Pose    Maintain pelvic tilt, lift pubic bone toward navel. Engage posterior hip muscles (firm glute muscles of leg in back position). To increase stretch, maintain balance and ease hips forward. Hold for __30__ secs. Repeat __1__ times each leg. Once a day.

## 2017-08-13 NOTE — Therapy (Signed)
[facility] [place] [address] [place], [place], [address] Phone: [phone]   Fax:  [phone]  Physical Therapy Evaluation  Patient Details  Name: Audrey Greer MRN: [id number] Date of Birth: [birth year] Referring Provider: Dr Teressa Lower  Encounter Date: 08/13/2017      PT End of Session - 08/13/17 1145    Visit Number 1   Number of Visits 12   Date for PT Re-Evaluation 09/24/17   PT Start Time 1145   PT Stop Time 1232   PT Time Calculation (min) 47 min   Activity Tolerance Patient tolerated treatment well      Past Medical History:  Diagnosis Date  . Anxiety   . Asthma   . Chronic bronchitis (HCC)   . Cystitis   . History of hypertension   . Hypertension   . Hypothyroidism   . Migraine    "a few/year" (10/01/2016)  . Pneumonia    "many many times; most recent was 11/2015" (10/01/2016)  . Polycystic ovarian syndrome     Past Surgical History:  Procedure Laterality Date  . CESAREAN SECTION  2007  . GANGLION CYST EXCISION Left ~ 2011  . HERNIA REPAIR    . INSERTION OF MESH N/A 10/01/2016   Procedure: INSERTION OF MESH;  Surgeon: Chevis Pretty III, MD;  [facility];  Service: General;  Laterality: N/A;  . LAPAROSCOPIC ASSISTED VENTRAL HERNIA REPAIR  10/01/2016   w'mesh  . LAPAROSCOPIC CHOLECYSTECTOMY  01/2007  . MIDDLE EAR SURGERY Right    "repaired hole in my eardrum from tube"  . TONSILLECTOMY AND ADENOIDECTOMY  1991  . TYMPANOSTOMY TUBE PLACEMENT Bilateral   . VENTRAL HERNIA REPAIR N/A 10/01/2016   Procedure: LAPAROSCOPIC VENTRAL HERNIA WITH MESH;  Surgeon: Chevis Pretty III, MD;  [facility];  Service: General;  Laterality: N/A;  . WISDOM TOOTH EXTRACTION      There were no vitals filed for this visit.       Subjective Assessment - 08/13/17 1148    Subjective Pt reports she first had Rt hip pain a couple years ago, thought it was more due to always sleeping on her Rt side.  This year she started  having more problems into the Rt lower quadrant, medical issues have been ruled out and they are wondering if the pain is stemming from the hip.    Pertinent History umbilical hernia repair 09/2016   How long can you sit comfortably? immedate pain with sitting.    How long can you walk comfortably? she is walking 2 miles a day, has increased soreness after this. ( walking seems to help it)    Diagnostic tests x-ray (-)    Patient Stated Goals decrease pain and see if the hip is the cause of the problems   Currently in Pain? Yes   Pain Score 2    Pain Location Hip   Pain Orientation Right   Pain Type Chronic pain   Pain Radiating Towards starts around ASIS area then has it into the low back and into front of Rt hip   Pain Onset More than a month ago   Pain Frequency Constant   Aggravating Factors  sitting, lying on Rt side,    Pain Relieving Factors walking, meds            [facility] PT Assessment - 08/13/17 0001      Assessment   Medical Diagnosis Rt hip bursitis   Referring Provider Dr Teressa Lower  Onset Date/Surgical Date 02/11/17   Hand Dominance Right   Next MD Visit 09/16/17   Prior Therapy none     Precautions   Precautions None     Balance Screen   Has the patient fallen in the past 6 months No     Home Environment   Living Environment Private residence   Living Arrangements Spouse/significant other;Children   Home Layout Two level  some difficulty first thing in AM     Prior Function   Level of Independence Independent   Vocation Works at home   [employer] 31 yo and 31 yo   Leisure craft - some limitations due to sitting at computer     Observation/Other Assessments   Focus on Therapeutic Outcomes (FOTO)  32% limited     Functional Tests   Functional tests Squat;Single leg stance     Squat   Comments WNL     Single Leg Stance   Comments WNL      Posture/Postural Control   Posture/Postural Control Postural limitations   Postural Limitations  Forward head;Increased thoracic kyphosis  extra abdominal girth, knee hyperextension     ROM / Strength   AROM / PROM / Strength AROM;Strength     AROM   AROM Assessment Site Lumbar   Lumbar Flexion to floor   Lumbar Extension WNL pain Rt low back   Lumbar - Right Side Bend WNl   Lumbar - Left Side Bend WNL   Lumbar - Right Rotation 75% present   Lumbar - Left Rotation WNL     Strength   Strength Assessment Site Hip;Knee;Ankle;Lumbar   Right/Left Hip Left;Right   Right Hip Flexion 5/5   Right Hip Extension 4+/5   Right Hip ABduction 4+/5   Left Hip Flexion 5/5   Left Hip Extension 4+/5   Left Hip ABduction 5/5   Right/Left Knee --  Lt WNL, Rt grossly 5-/5 as compared to Lt    Right/Left Ankle --  WNL   Lumbar Extension --  multifidi poor     Flexibility   Soft Tissue Assessment /Muscle Length --  LE's WNL except slight tightness in Rt TFL     Palpation   Palpation comment tight and tender in Rt TFL and glut med and superior glut max            Objective measurements completed on examination: See above findings.          [facility] Adult PT Treatment/Exercise - 08/13/17 0001      Exercises   Exercises Lumbar     Lumbar Exercises: Stretches   Single Knee to Chest Stretch 30 seconds   Lower Trunk Rotation 30 seconds   ITB Stretch 30 seconds  cross body stretch with strap     Lumbar Exercises: Supine   Ab Set 10 reps;5 seconds     Lumbar Exercises: Prone   Other Prone Lumbar Exercises 10 reps, 5 sec hold pelvic press.                 PT Education - 08/13/17 1227    Education provided Yes   Education Details HEP   Person(s) Educated Patient   Methods Explanation;Demonstration;Handout   Comprehension Returned demonstration;Verbalized understanding             PT Long Term Goals - 08/13/17 1246      PT LONG TERM GOAL #1   Title I with advanced HEP ( 09/24/17)    Time 6   Period  Weeks   Status New     PT LONG TERM GOAL #2    Title improve FOTO =/< 24% limited ( 09/24/17)    Time 6   Period Weeks   Status New     PT LONG TERM GOAL #3   Title increase Rt hip strength =/> Lt ( 09/24/17)    Time 6   Period Weeks   Status New     PT LONG TERM GOAL #4   Title demo strong contraction of TA and multifidi ( 09/24/17)    Time 6   Period Weeks   Status New     PT LONG TERM GOAL #5   Title report =/> 75% reduction in Rt lower quadrant pain in sitting ( 09/24/17)    Time 6   Period Weeks   Status New                Plan - 08/13/17 1239    Clinical Impression Statement 31 yo female presents with 2 yr h/o Rt hip pain.  Over the last 6 months patient has had a change in her symptoms and the pain has moved into her groin, top of the iliac crest and into the Rt low back.  She is more limited in her sitting tolerance as compared to walking.  She has weakness in her core and Rt hip as well as pain and trigger points in the Rt TFL and gluts.  She overweight and this will complicate her healing.    History and Personal Factors relevant to plan of care: unbilical hernia repair 09/2016   Clinical Presentation Stable   Clinical Decision Making Low   Rehab Potential Good   PT Frequency 2x / week  every other week once a week due to her schedule   PT Duration 6 weeks   PT Treatment/Interventions Moist Heat;Traction;Ultrasound;Therapeutic exercise;Dry needling;Manual techniques;Neuromuscular re-education;Cryotherapy;Electrical Stimulation;Iontophoresis /ml Dexamethasone;Passive range of motion;Patient/family education   PT Next Visit Plan manual work to Rt low back and hip, core strengthening, modalities PRN   Consulted and Agree with Plan of Care Patient      Patient will benefit from skilled therapeutic intervention in order to improve the following deficits and impairments:  Increased muscle spasms, Obesity, Pain, Decreased strength  Visit Diagnosis: Pain in right hip - Plan: PT plan of care  cert/re-cert  Muscle weakness (generalized) - Plan: PT plan of care cert/re-cert  Other symptoms and signs involving the musculoskeletal system - Plan: PT plan of care cert/re-cert     Problem List Patient Active Problem List   Diagnosis Date Noted  . Panic attack 06/07/2017  . Right thyroid nodule 03/21/2017  . GAD (generalized anxiety disorder) 10/24/2016  . Ventral hernia without obstruction or gangrene 10/01/2016  . Morbid obesity (HCC) 04/14/2015  . History of irregular menstrual cycles 05/27/2014  . VBAC, delivered, current hospitalization 12/31/2011  . Third degree perineal laceration, delivered, current hospitalization 12/31/2011  . PCOS (polycystic ovarian syndrome) 10/09/2011  . Hypothyroidism 10/09/2011  . Asthma 10/09/2011  . History of hemolysis, elevated liver enzymes, and low platelet (HELLP) syndrome 10/09/2011    Roderic Scarce PT  08/13/2017, 12:50 PM  [facility] [address] [place], [place], [address] Phone: [phone]   Fax:  [phone]  Name: Audrey Greer MRN: [id number] Date of Birth: 15-Mar-1986

## 2017-08-16 ENCOUNTER — Ambulatory Visit (INDEPENDENT_AMBULATORY_CARE_PROVIDER_SITE_OTHER): Payer: Managed Care, Other (non HMO) | Admitting: Physical Therapy

## 2017-08-16 DIAGNOSIS — R29898 Other symptoms and signs involving the musculoskeletal system: Secondary | ICD-10-CM

## 2017-08-16 DIAGNOSIS — M6281 Muscle weakness (generalized): Secondary | ICD-10-CM | POA: Diagnosis not present

## 2017-08-16 DIAGNOSIS — M25551 Pain in right hip: Secondary | ICD-10-CM

## 2017-08-16 NOTE — Patient Instructions (Signed)
Pelvic Press     Place hands under belly between navel and pubic bone, palms up. Feel pressure on hands. Increase pressure on hands by pressing pelvis down. This is NOT a pelvic tilt. Hold __5_ seconds. Relax. Repeat _10__ times. Once a day.  KNEE: Flexion - Prone   Hold pelvic press. Bend knee, then return the foot down. Repeat on opposite leg. Do not raise hips. _10__ reps per set. When this is mastered, pull both heels up at same time, x 10 reps.  Once a day   Leg Lift: One-Leg   Press pelvis down. Keep knee straight; lengthen and lift one leg (from waist). Do not twist body. Keep other leg down. Hold _1__ seconds. Relax. Repeat 10 time. Repeat with other leg. [facility] [address] [place], [place] [address]  [phone] (office) [phone] (fax)   

## 2017-08-16 NOTE — Therapy (Addendum)
[place], [place], [address] Phone: [phone]   Fax:  [phone]  Physical Therapy Treatment  Patient Details  Name: Audrey Greer MRN: [id number] Date of Birth: 05/20/1986 Referring Provider: Dr. Georgina Snell  Encounter Date: 08/16/2017      PT End of Session - 08/16/17 0929    Visit Number 2   Number of Visits 12   Date for PT Re-Evaluation 09/24/17   PT Start Time 0930   PT Stop Time 1028   PT Time Calculation (min) 58 min   Activity Tolerance Patient tolerated treatment well   Behavior During Therapy [facility] for tasks assessed/performed      Past Medical History:  Diagnosis Date  . Anxiety   . Asthma   . Chronic bronchitis ([place])   . Cystitis   . History of hypertension   . Hypertension   . Hypothyroidism   . Migraine    "a few/year" (10/01/2016)  . Pneumonia    "many many times; most recent was 11/2015" (10/01/2016)  . Polycystic ovarian syndrome     Past Surgical History:  Procedure Laterality Date  . CESAREAN SECTION  2007  . GANGLION CYST EXCISION Left ~ 2011  . HERNIA REPAIR    . INSERTION OF MESH N/A 10/01/2016   Procedure: INSERTION OF MESH;  Surgeon: Autumn Messing III, MD;  Location: [place];  Service: General;  Laterality: N/A;  . LAPAROSCOPIC ASSISTED VENTRAL HERNIA REPAIR  10/01/2016   w'mesh  . LAPAROSCOPIC CHOLECYSTECTOMY  01/2007  . MIDDLE EAR SURGERY Right    "repaired hole in my eardrum from tube"  . TONSILLECTOMY AND ADENOIDECTOMY  1991  . TYMPANOSTOMY TUBE PLACEMENT Bilateral   . VENTRAL HERNIA REPAIR N/A 10/01/2016   Procedure: LAPAROSCOPIC VENTRAL HERNIA WITH MESH;  Surgeon: Autumn Messing III, MD;  Location: [place];  Service: General;  Laterality: N/A;  . WISDOM TOOTH EXTRACTION      There were no vitals filed for this visit.      Subjective Assessment - 08/16/17 0936    Subjective Audrey Greer reports that her Rt hip was "On fire" the day after eval.  She has used  ibprofen for pain; doesn't have an ice pack/heating pad at home. She has done her HEP each day.  She feels like she has had a catch in low back since eval.    Currently in Pain? Yes   Pain Score 3    Pain Location Back   Pain Orientation Right   Pain Descriptors / Indicators Sore   Pain Radiating Towards low back into hip/mid lateral thigh   Aggravating Factors  prolonged sitting, lying Rt side    Pain Relieving Factors walking, medication            [facility] PT Assessment - 08/16/17 0001      Assessment   Medical Diagnosis Rt hip bursitis   Referring Provider Dr. Georgina Snell   Onset Date/Surgical Date 02/11/17   Hand Dominance Right   Next MD Visit 09/16/17          [facility] Adult PT Treatment/Exercise - 08/16/17 0001      Self-Care   Self-Care Heat/Ice Application;Other Self-Care Comments   Heat/Ice Application Educated pt on heat/cold application parameters.   Other Self-Care Comments  Educated pt on self massage with ball to Rt hip and low back, as well as MFR to Rt ant hip. Pt verbalized understanding     Lumbar Exercises: Stretches   Single Knee to  Chest Stretch 20 seconds;1 rep   Lower Trunk Rotation 2 reps;20 seconds   Quadruped Mid Back Stretch 3 reps  childs pose, then with lateral trunk flexion   ITB Stretch 30 seconds  cross body stretch with strap   Piriformis Stretch 2 reps;30 seconds     Lumbar Exercises: Aerobic   Stationary Bike NuStep L4: 87mn     Lumbar Exercises: Supine   Ab Set 5 reps  difficulty engaging despite cues     Lumbar Exercises: Prone   Other Prone Lumbar Exercises pelvic press, 5 sec x 5 reps; then pelvic press with unilateral knee bends x 5 reps each side and hip ext - knee straight x 5 each leg .      Modalities   Modalities Cryotherapy;Electrical Stimulation     Cryotherapy   Number Minutes Cryotherapy 15 Minutes   Cryotherapy Location Lumbar Spine;Hip   Type of Cryotherapy Ice pack     Electrical Stimulation   Electrical  Stimulation Location Rt lateral hip and Rt low back   pt in Lt sidelying   Electrical Stimulation Action IFC   Electrical Stimulation Parameters to tolerance    Electrical Stimulation Goals Pain;Tone      Seated:  Transverse Abdominal isometric with pt's hands pressing down/up/side into therapists hands for 5 sec hold.  X 2 reps each direction.  Improved activation felt/ reported.          PT Long Term Goals - 08/16/17 0929      PT LONG TERM GOAL #1   Title I with advanced HEP ( 09/24/17)    Time 6   Period Weeks   Status On-going     PT LONG TERM GOAL #2   Title improve FOTO =/< 24% limited ( 09/24/17)    Time 6   Period Weeks   Status On-going     PT LONG TERM GOAL #3   Title increase Rt hip strength =/> Lt ( 09/24/17)    Time 6   Period Weeks   Status On-going     PT LONG TERM GOAL #4   Title demo strong contraction of TA and multifidi ( 09/24/17)    Time 6   Period Weeks   Status On-going     PT LONG TERM GOAL #5   Title report =/> 75% reduction in Rt lower quadrant pain in sitting ( 09/24/17)    Time 6   Period Weeks   Status On-going               Plan - 08/16/17 1020    Clinical Impression Statement Pt had difficulty engaging TA despite multiple cues; able to finally feel engagement in sitting with hands pressing into therapist's.  She had a low tolerance for estim at end of session; reported no change in pain level with use of ice/estim. No goals met yet/ only 2nd visit.     Rehab Potential Good   PT Frequency 2x / week  every other week due to schedule   PT Duration 6 weeks   PT Treatment/Interventions Moist Heat;Traction;Ultrasound;Therapeutic exercise;Dry needling;Manual techniques;Neuromuscular re-education;Cryotherapy;Electrical Stimulation;Iontophoresis 427mml Dexamethasone;Passive range of motion;Patient/family education   PT Next Visit Plan manual work to Rt low back and hip, including psoas.   Consulted and Agree with Plan of Care Patient       Patient will benefit from skilled therapeutic intervention in order to improve the following deficits and impairments:  Increased muscle spasms, Obesity, Pain, Decreased strength  Visit Diagnosis: Pain in  right hip  Muscle weakness (generalized)  Other symptoms and signs involving the musculoskeletal system     Problem List Patient Active Problem List   Diagnosis Date Noted  . Panic attack 06/07/2017  . Right thyroid nodule 03/21/2017  . GAD (generalized anxiety disorder) 10/24/2016  . Ventral hernia without obstruction or gangrene 10/01/2016  . Morbid obesity ([place]) 04/14/2015  . History of irregular menstrual cycles 05/27/2014  . VBAC, delivered, current hospitalization 12/31/2011  . Third degree perineal laceration, delivered, current hospitalization 12/31/2011  . PCOS (polycystic ovarian syndrome) 10/09/2011  . Hypothyroidism 10/09/2011  . Asthma 10/09/2011  . History of hemolysis, elevated liver enzymes, and low platelet (HELLP) syndrome 10/09/2011   Kerin Perna, PTA 08/16/17 1:02 PM  [facility] [place], [place], [address] Phone: [phone]   Fax:  [phone]  Name: Audrey Greer MRN: [id number] Date of Birth: [birth year]

## 2017-08-21 ENCOUNTER — Ambulatory Visit (INDEPENDENT_AMBULATORY_CARE_PROVIDER_SITE_OTHER): Payer: Managed Care, Other (non HMO) | Admitting: Physical Therapy

## 2017-08-21 DIAGNOSIS — M25551 Pain in right hip: Secondary | ICD-10-CM

## 2017-08-21 DIAGNOSIS — M6281 Muscle weakness (generalized): Secondary | ICD-10-CM

## 2017-08-21 DIAGNOSIS — R29898 Other symptoms and signs involving the musculoskeletal system: Secondary | ICD-10-CM

## 2017-08-21 NOTE — Therapy (Signed)
[facility] [place] [address] [place], [place], [address] Phone: [phone]   Fax:  [phone]  Physical Therapy Treatment  Patient Details  Name: Audrey Greer MRN: [id number] Date of Birth: 03/22/1986 Referring Provider: Dr. Denyse Amass  Encounter Date: 08/21/2017      PT End of Session - 08/21/17 0859    Visit Number 3   Number of Visits 12   Date for PT Re-Evaluation 09/24/17   PT Start Time 0804   PT Stop Time 0855  MHP last 12 min    PT Time Calculation (min) 51 min   Activity Tolerance Patient tolerated treatment well   Behavior During Therapy [facility] for tasks assessed/performed      Past Medical History:  Diagnosis Date  . Anxiety   . Asthma   . Chronic bronchitis (HCC)   . Cystitis   . History of hypertension   . Hypertension   . Hypothyroidism   . Migraine    "a few/year" (10/01/2016)  . Pneumonia    "many many times; most recent was 11/2015" (10/01/2016)  . Polycystic ovarian syndrome     Past Surgical History:  Procedure Laterality Date  . CESAREAN SECTION  2007  . GANGLION CYST EXCISION Left ~ 2011  . HERNIA REPAIR    . INSERTION OF MESH N/A 10/01/2016   Procedure: INSERTION OF MESH;  Surgeon: Chevis Pretty III, MD;  [facility];  Service: General;  Laterality: N/A;  . LAPAROSCOPIC ASSISTED VENTRAL HERNIA REPAIR  10/01/2016   w'mesh  . LAPAROSCOPIC CHOLECYSTECTOMY  01/2007  . MIDDLE EAR SURGERY Right    "repaired hole in my eardrum from tube"  . TONSILLECTOMY AND ADENOIDECTOMY  1991  . TYMPANOSTOMY TUBE PLACEMENT Bilateral   . VENTRAL HERNIA REPAIR N/A 10/01/2016   Procedure: LAPAROSCOPIC VENTRAL HERNIA WITH MESH;  Surgeon: Chevis Pretty III, MD;  [facility];  Service: General;  Laterality: N/A;  . WISDOM TOOTH EXTRACTION      There were no vitals filed for this visit.      Subjective Assessment - 08/21/17 0808    Subjective Pt reports the pain is back down to what it was prior to eval.   (Pain had gone up a little with initiation of exercises).  She has been using ice / heat on hip 1x/day with relief.  She is not using as much ibprofen.    Currently in Pain? Yes   Pain Score 1    Pain Location Hip   Pain Orientation Anterior;Right   Pain Descriptors / Indicators Aching  sometimes sharp   Aggravating Factors  prolonged sitting, lying on Rt side.    Pain Relieving Factors walking, ice, heat, medicaion.           [facility] Adult PT Treatment/Exercise - 08/21/17 0001      Self-Care   Other Self-Care Comments  Pt educated on self massage with ball to Rt ant hip/ thigh; demo provided.  Pt verbalized understanding.      Lumbar Exercises: Stretches   Lower Trunk Rotation 2 reps;20 seconds  single knee rotation   Lower Trunk Rotation Limitations Pt reported increased tightness in Rt ant hip with this exercise; stopped.      Lumbar Exercises: Aerobic   Stationary Bike L2: 5 min      Knee/Hip Exercises: Stretches   Hip Flexor Stretch Right;3 reps;30 seconds  thomas position, Lt knee to chest   Other Knee/Hip Stretches hip flexor / psoas stretch in high kneeling  with arm overhead x 30 sec, x 2 reps.      Modalities   Modalities Moist Heat     Moist Heat Therapy   Number Minutes Moist Heat 12 Minutes   Moist Heat Location Hip  Rt     Electrical Stimulation   Electrical Stimulation Location --  pt req to not use anymore     Manual Therapy   Manual Therapy Myofascial release;Soft tissue mobilization   Soft tissue mobilization STM to Rt lateral quad, ITB, TFL   Myofascial Release MFR to Rt iliopsoas     reviewed all other stretches in HEP, brief performance of each.         PT Long Term Goals - 08/16/17 0929      PT LONG TERM GOAL #1   Title I with advanced HEP ( 09/24/17)    Time 6   Period Weeks   Status On-going     PT LONG TERM GOAL #2   Title improve FOTO =/< 24% limited ( 09/24/17)    Time 6   Period Weeks   Status On-going     PT LONG TERM GOAL  #3   Title increase Rt hip strength =/> Lt ( 09/24/17)    Time 6   Period Weeks   Status On-going     PT LONG TERM GOAL #4   Title demo strong contraction of TA and multifidi ( 09/24/17)    Time 6   Period Weeks   Status On-going     PT LONG TERM GOAL #5   Title report =/> 75% reduction in Rt lower quadrant pain in sitting ( 09/24/17)    Time 6   Period Weeks   Status On-going               Plan - 08/21/17 0850    Clinical Impression Statement Pt reported she had anxiety with use of estim last session; requested to not use again.  Stretches for HEP were modified to avoid aggrivation of tight area in Rt hip flexor.  Palpable tightness reduced with manual therapy.  PRogressing towards goals.    Rehab Potential Good   PT Frequency 2x / week   PT Duration 6 weeks   PT Treatment/Interventions Moist Heat;Traction;Ultrasound;Therapeutic exercise;Dry needling;Manual techniques;Neuromuscular re-education;Cryotherapy;Electrical Stimulation;Iontophoresis /ml Dexamethasone;Passive range of motion;Patient/family education   PT Next Visit Plan assess pelvic alignment. cont manual work to Rt hip/ low back/ thigh.    Consulted and Agree with Plan of Care Patient      Patient will benefit from skilled therapeutic intervention in order to improve the following deficits and impairments:  Increased muscle spasms, Obesity, Pain, Decreased strength  Visit Diagnosis: Pain in right hip  Muscle weakness (generalized)  Other symptoms and signs involving the musculoskeletal system     Problem List Patient Active Problem List   Diagnosis Date Noted  . Panic attack 06/07/2017  . Right thyroid nodule 03/21/2017  . GAD (generalized anxiety disorder) 10/24/2016  . Ventral hernia without obstruction or gangrene 10/01/2016  . Morbid obesity (HCC) 04/14/2015  . History of irregular menstrual cycles 05/27/2014  . VBAC, delivered, current hospitalization 12/31/2011  . Third degree perineal  laceration, delivered, current hospitalization 12/31/2011  . PCOS (polycystic ovarian syndrome) 10/09/2011  . Hypothyroidism 10/09/2011  . Asthma 10/09/2011  . History of hemolysis, elevated liver enzymes, and low platelet (HELLP) syndrome 10/09/2011   Mayer Camel, PTA 08/21/17 9:01 AM  [facility] [address] [place],  [place], [address] Phone: [phone]   Fax:  [phone]  Name: Audrey Greer MRN: [id number] Date of Birth: 03/05/1986

## 2017-08-26 ENCOUNTER — Encounter: Payer: Self-pay | Admitting: Osteopathic Medicine

## 2017-08-26 DIAGNOSIS — N644 Mastodynia: Secondary | ICD-10-CM

## 2017-08-27 ENCOUNTER — Ambulatory Visit (INDEPENDENT_AMBULATORY_CARE_PROVIDER_SITE_OTHER): Payer: Managed Care, Other (non HMO) | Admitting: Physical Therapy

## 2017-08-27 DIAGNOSIS — R29898 Other symptoms and signs involving the musculoskeletal system: Secondary | ICD-10-CM

## 2017-08-27 DIAGNOSIS — M25551 Pain in right hip: Secondary | ICD-10-CM

## 2017-08-27 DIAGNOSIS — M6281 Muscle weakness (generalized): Secondary | ICD-10-CM | POA: Diagnosis not present

## 2017-08-27 NOTE — Therapy (Signed)
[place], [place], [address] Phone: [phone]   Fax:  [phone]  Physical Therapy Treatment  Patient Details  Name: Audrey Greer MRN: [id number] Date of Birth: 1986-04-17 Referring Provider: Dr. Georgina Snell   Encounter Date: 08/27/2017      PT End of Session - 08/27/17 0843    Visit Number 4   Number of Visits 12   Date for PT Re-Evaluation 09/24/17   PT Start Time 0804   PT Stop Time 5830   PT Time Calculation (min) 39 min   Activity Tolerance Patient tolerated treatment well;No increased pain   Behavior During Therapy WFL for tasks assessed/performed      Past Medical History:  Diagnosis Date  . Anxiety   . Asthma   . Chronic bronchitis ([place])   . Cystitis   . History of hypertension   . Hypertension   . Hypothyroidism   . Migraine    "a few/year" (10/01/2016)  . Pneumonia    "many many times; most recent was 11/2015" (10/01/2016)  . Polycystic ovarian syndrome     Past Surgical History:  Procedure Laterality Date  . CESAREAN SECTION  2007  . GANGLION CYST EXCISION Left ~ 2011  . HERNIA REPAIR    . INSERTION OF MESH N/A 10/01/2016   Procedure: INSERTION OF MESH;  Surgeon: Autumn Messing III, MD;  Location: [facility];  Service: General;  Laterality: N/A;  . LAPAROSCOPIC ASSISTED VENTRAL HERNIA REPAIR  10/01/2016   w'mesh  . LAPAROSCOPIC CHOLECYSTECTOMY  01/2007  . MIDDLE EAR SURGERY Right    "repaired hole in my eardrum from tube"  . TONSILLECTOMY AND ADENOIDECTOMY  1991  . TYMPANOSTOMY TUBE PLACEMENT Bilateral   . VENTRAL HERNIA REPAIR N/A 10/01/2016   Procedure: LAPAROSCOPIC VENTRAL HERNIA WITH MESH;  Surgeon: Autumn Messing III, MD;  Location: [facility];  Service: General;  Laterality: N/A;  . WISDOM TOOTH EXTRACTION      There were no vitals filed for this visit.      Subjective Assessment - 08/27/17 0809    Subjective Audrey Greer reports she has been doing self massage to her back/ hip  and HEP 1-2x/day.  She had a flare up in symptoms this weekend. She used ice/heat and it resolved by the next day.   Pt is interested in easing back into light weight training and yoga class.    Patient Stated Goals decrease pain and see if the hip is the cause of the problems   Currently in Pain? Yes   Pain Score 1    Pain Location Hip   Pain Orientation Right;Posterior   Pain Descriptors / Indicators Dull   Aggravating Factors  transitional movements, lying on Rt side, prolonged sitting   Pain Relieving Factors walking, ice, heat            [facility] PT Assessment - 08/27/17 0001      Assessment   Medical Diagnosis Rt hip bursitis   Referring Provider Dr. Georgina Snell    Onset Date/Surgical Date 02/11/17   Hand Dominance Right   Next MD Visit 09/16/17     AROM   Lumbar Extension WNL   Lumbar - Right Rotation WNL   Lumbar - Left Rotation WNL     Strength   Right Hip Extension --  5-/5   Right Hip ABduction --  5-/5   Left Hip Extension 5/5   Left Hip ABduction 5/5     Palpation   Spinal mobility  CPA mobs sacrum to T12 WNL; pain at Lt L4 UPA transverse process. Rt side UPA WNL  performed by Jeral Pinch, PT   SI assessment  level. tender on Rt SI joint                      [facility] Adult PT Treatment/Exercise - 08/27/17 0001      Lumbar Exercises: Stretches   Passive Hamstring Stretch 2 reps;30 seconds   Quadruped Mid Back Stretch 1 rep;30 seconds   ITB Stretch 2 reps;30 seconds   Piriformis Stretch 2 reps;30 seconds     Lumbar Exercises: Aerobic   Stationary Bike NuStep L4: 5 min      Modalities   Modalities Iontophoresis     Electrical Stimulation   Electrical Stimulation Location --  pt req to not use anymore     Iontophoresis   Type of Iontophoresis Dexamethasone   Location Rt SI joint   Dose 1.0 cc   Time 80 mA; 6 hr patch      Manual Therapy   Soft tissue mobilization STM to post Rt hip, glute, piriformis   Myofascial Release MFR to Rt  iliopsoas                PT Education - 08/27/17 0951    Education provided Yes   Education Details info on [employer]) Educated Patient   Methods Explanation   Comprehension Verbalized understanding             PT Long Term Goals - 08/27/17 0812      PT LONG TERM GOAL #1   Title I with advanced HEP ( 09/24/17)    Time 6   Period Weeks   Status On-going     PT LONG TERM GOAL #2   Title improve FOTO =/< 24% limited ( 09/24/17)    Time 6   Period Weeks   Status On-going     PT LONG TERM GOAL #3   Title increase Rt hip strength =/> Lt ( 09/24/17)    Time 6   Period Weeks   Status Partially Met     PT LONG TERM GOAL #4   Title demo strong contraction of TA and multifidi ( 09/24/17)    Time 6   Period Weeks   Status Partially Met     PT LONG TERM GOAL #5   Title report =/> 75% reduction in Rt lower quadrant pain in sitting ( 09/24/17)    Time 6   Period Weeks   Status On-going               Plan - 08/27/17 0819    Clinical Impression Statement Pt's pelvis ,SI, lumbar spine were in good alignment per supervising PT, Jeral Pinch.  Pt demonstrated improved Rt hip strength and lumbar ROM.   She continues with some point tenderness at Rt SI joint; trial of ionto applied to this area.  She has partially met LTG #3 and is making good progress towards remaining goals.  Suggested pt ease gently into yoga class and light weight training, listening to her body and stopping if she has increased pain in Rt hip/LB.  Also suggested she speak with her yoga instructor to have modifications as to not exacerbate her Rt hip.    Rehab Potential Good   PT Frequency 2x / week   PT Duration 6 weeks   PT Treatment/Interventions Moist Heat;Traction;Ultrasound;Therapeutic exercise;Dry needling;Manual techniques;Neuromuscular re-education;Cryotherapy;Electrical Stimulation;Iontophoresis 60m/ml Dexamethasone;Passive range  of motion;Patient/family education   PT Next  Visit Plan assess response to ionto. add non-thermal [place] to same area.  cont manual therapy to iliopsoas.    Consulted and Agree with Plan of Care Patient      Patient will benefit from skilled therapeutic intervention in order to improve the following deficits and impairments:  Increased muscle spasms, Obesity, Pain, Decreased strength  Visit Diagnosis: Pain in right hip  Muscle weakness (generalized)  Other symptoms and signs involving the musculoskeletal system     Problem List Patient Active Problem List   Diagnosis Date Noted  . Panic attack 06/07/2017  . Right thyroid nodule 03/21/2017  . GAD (generalized anxiety disorder) 10/24/2016  . Ventral hernia without obstruction or gangrene 10/01/2016  . Morbid obesity ([place]) 04/14/2015  . History of irregular menstrual cycles 05/27/2014  . VBAC, delivered, current hospitalization 12/31/2011  . Third degree perineal laceration, delivered, current hospitalization 12/31/2011  . PCOS (polycystic ovarian syndrome) 10/09/2011  . Hypothyroidism 10/09/2011  . Asthma 10/09/2011  . History of hemolysis, elevated liver enzymes, and low platelet (HELLP) syndrome 10/09/2011   Kerin Perna, PTA 08/27/17 9:59 AM   Jeral Pinch, PT 08/27/17 11:04 AM   [place], [place], [address] Phone: [phone]   Fax:  [phone]  Name: Audrey Greer MRN: [id number] Date of Birth: [birth year]

## 2017-08-29 ENCOUNTER — Other Ambulatory Visit: Payer: Managed Care, Other (non HMO)

## 2017-08-30 ENCOUNTER — Ambulatory Visit (INDEPENDENT_AMBULATORY_CARE_PROVIDER_SITE_OTHER): Payer: Managed Care, Other (non HMO) | Admitting: Physical Therapy

## 2017-08-30 ENCOUNTER — Ambulatory Visit
Admission: RE | Admit: 2017-08-30 | Discharge: 2017-08-30 | Disposition: A | Discharge status: Home / Self Care | Payer: Managed Care, Other (non HMO) | Source: Ambulatory Visit | Attending: Osteopathic Medicine | Admitting: Osteopathic Medicine

## 2017-08-30 DIAGNOSIS — M6281 Muscle weakness (generalized): Secondary | ICD-10-CM | POA: Diagnosis not present

## 2017-08-30 DIAGNOSIS — R29898 Other symptoms and signs involving the musculoskeletal system: Secondary | ICD-10-CM

## 2017-08-30 DIAGNOSIS — M25551 Pain in right hip: Secondary | ICD-10-CM | POA: Diagnosis not present

## 2017-08-30 NOTE — Therapy (Signed)
[place], [place], [address] Phone: [phone]   Fax:  [phone]  Physical Therapy Treatment  Patient Details  Name: Audrey Greer MRN: [id number] Date of Birth: 06/09/1986 Referring Provider: Dr. Georgina Snell  Encounter Date: 08/30/2017      PT End of Session - 08/30/17 0812    Visit Number 5   Number of Visits 12   Date for PT Re-Evaluation 09/24/17   PT Start Time 0803   PT Stop Time 0850   PT Time Calculation (min) 47 min   Activity Tolerance Patient tolerated treatment well   Behavior During Therapy [facility] for tasks assessed/performed      Past Medical History:  Diagnosis Date  . Anxiety   . Asthma   . Chronic bronchitis ([place])   . Cystitis   . History of hypertension   . Hypertension   . Hypothyroidism   . Migraine    "a few/year" (10/01/2016)  . Pneumonia    "many many times; most recent was 11/2015" (10/01/2016)  . Polycystic ovarian syndrome     Past Surgical History:  Procedure Laterality Date  . CESAREAN SECTION  2007  . GANGLION CYST EXCISION Left ~ 2011  . HERNIA REPAIR    . INSERTION OF MESH N/A 10/01/2016   Procedure: INSERTION OF MESH;  Surgeon: Autumn Messing III, MD;  Location: [facility];  Service: General;  Laterality: N/A;  . LAPAROSCOPIC ASSISTED VENTRAL HERNIA REPAIR  10/01/2016   w'mesh  . LAPAROSCOPIC CHOLECYSTECTOMY  01/2007  . MIDDLE EAR SURGERY Right    "repaired hole in my eardrum from tube"  . TONSILLECTOMY AND ADENOIDECTOMY  1991  . TYMPANOSTOMY TUBE PLACEMENT Bilateral   . VENTRAL HERNIA REPAIR N/A 10/01/2016   Procedure: LAPAROSCOPIC VENTRAL HERNIA WITH MESH;  Surgeon: Autumn Messing III, MD;  Location: [facility];  Service: General;  Laterality: N/A;  . WISDOM TOOTH EXTRACTION      There were no vitals filed for this visit.      Subjective Assessment - 08/30/17 0806    Subjective Pt reports that she feels the (ionto) patch on Rt hip helped.  She hasn't been able  to walk this week due to schedule, and she feels this has had an impact on her Rt hip- making it hurt a little more in the front.  She was able to sleep on her Rt side last night; hasn't been able to do this in a long time.    Patient Stated Goals decrease pain and see if the hip is the cause of the problems   Currently in Pain? Yes   Pain Score 2    Pain Location Hip   Pain Orientation Right;Lateral;Anterior   Pain Descriptors / Indicators Dull  "tender to touch"   Aggravating Factors  lying on Rt side, touching area   Pain Relieving Factors walking, ice, heat            [facility] PT Assessment - 08/30/17 0001      Assessment   Medical Diagnosis Rt hip bursitis   Referring Provider Dr. Georgina Snell   Onset Date/Surgical Date 02/11/17   Hand Dominance Right           [facility] Adult PT Treatment/Exercise - 08/30/17 0001      Lumbar Exercises: Stretches   ITB Stretch 30 seconds;3 reps  standing version, then 1 rep of supine w/ strap     Lumbar Exercises: Aerobic   Tread Mill 5 min @ 2.5  mph     Lumbar Exercises: Standing   Other Standing Lumbar Exercises Hip abd x 10 each leg   Other Standing Lumbar Exercises curtsy lunges x 5 reps each side.      Lumbar Exercises: Sidelying   Clam 10 reps, RLE  with core engaged.    Hip Abduction 5 reps  RLE, Low back began hurting- stopped     Lumbar Exercises: Prone   Other Prone Lumbar Exercises childs pose with yoga block for head 15 sec, then lateral trunk flexion x 30 sec x 2 reps, 2 sets      Knee/Hip Exercises: Stretches   Active Hamstring Stretch Right;Left;30 seconds;3 reps   Hip Flexor Stretch Right;Left;2 reps;30 seconds  thomas position, Lt knee to chest   Hip Flexor Stretch Limitations (kneeling with arm overhead)   Piriformis Stretch Right;Left;2 reps;30 seconds     Iontophoresis   Type of Iontophoresis Dexamethasone   Location Rt iliac crest (lateral hip)   Dose 1.0 cc   Time 80 mA, 6 hr patch     Manual Therapy   Soft  tissue mobilization STM to post Rt hip, glute, piriformis. TPR to Rt QL, lumbar paraspinals.                      PT Long Term Goals - 08/27/17 3536      PT LONG TERM GOAL #1   Title I with advanced HEP ( 09/24/17)    Time 6   Period Weeks   Status On-going     PT LONG TERM GOAL #2   Title improve FOTO =/< 24% limited ( 09/24/17)    Time 6   Period Weeks   Status On-going     PT LONG TERM GOAL #3   Title increase Rt hip strength =/> Lt ( 09/24/17)    Time 6   Period Weeks   Status Partially Met     PT LONG TERM GOAL #4   Title demo strong contraction of TA and multifidi ( 09/24/17)    Time 6   Period Weeks   Status Partially Met     PT LONG TERM GOAL #5   Title report =/> 75% reduction in Rt lower quadrant pain in sitting ( 09/24/17)    Time 6   Period Weeks   Status On-going               Plan - 08/30/17 0949    Clinical Impression Statement Pt had positive response to ionto patch applied last visit.  Pt also reporting decreased overall pain, especially with attempts to lay on Rt side.  Pt fatigued quickly with Rt hip abduction exercise, as well as had some mild increase in Rt low back pain due to compensation.  This pain was reduced / eliminated with stretches today.  Pt progressing gradually towards established goals.    Rehab Potential Good   PT Frequency 2x / week   PT Duration 6 weeks   PT Treatment/Interventions Moist Heat;Traction;Ultrasound;Therapeutic exercise;Dry needling;Manual techniques;Neuromuscular re-education;Cryotherapy;Electrical Stimulation;Iontophoresis '4mg'$ /ml Dexamethasone;Passive range of motion;Patient/family education   PT Next Visit Plan assess response to ionto to lateral hip. cont manual therapy to Rt low back and hip, strengthen hip abductors. MMT hips.      Patient will benefit from skilled therapeutic intervention in order to improve the following deficits and impairments:  Increased muscle spasms, Obesity, Pain,  Decreased strength  Visit Diagnosis: Pain in right hip  Muscle weakness (generalized)  Other symptoms  and signs involving the musculoskeletal system     Problem List Patient Active Problem List   Diagnosis Date Noted  . Panic attack 06/07/2017  . Right thyroid nodule 03/21/2017  . GAD (generalized anxiety disorder) 10/24/2016  . Ventral hernia without obstruction or gangrene 10/01/2016  . Morbid obesity ([place]) 04/14/2015  . History of irregular menstrual cycles 05/27/2014  . VBAC, delivered, current hospitalization 12/31/2011  . Third degree perineal laceration, delivered, current hospitalization 12/31/2011  . PCOS (polycystic ovarian syndrome) 10/09/2011  . Hypothyroidism 10/09/2011  . Asthma 10/09/2011  . History of hemolysis, elevated liver enzymes, and low platelet (HELLP) syndrome 10/09/2011   Kerin Perna, PTA 08/30/17 10:06 AM  [place], [place], [address] Phone: [phone]   Fax:  [phone]  Name: FREDRICKA KOHRS MRN: [id number] Date of Birth: 06-13-1986

## 2017-08-30 NOTE — Patient Instructions (Signed)
Abduction: Clam (Eccentric) - Side-Lying   Pull stomach in, lengthen through right hip to level them.  Lie on side with knees bent. Lift top knee, keeping feet together. Keep trunk steady. Slowly lower for 3-5 seconds. _10__ reps per set, _2__ sets per day.

## 2017-09-04 ENCOUNTER — Encounter: Payer: Managed Care, Other (non HMO) | Admitting: Rehabilitative and Restorative Service Providers"

## 2017-09-18 ENCOUNTER — Ambulatory Visit (INDEPENDENT_AMBULATORY_CARE_PROVIDER_SITE_OTHER): Payer: Managed Care, Other (non HMO) | Admitting: Physical Therapy

## 2017-09-18 ENCOUNTER — Encounter: Payer: Self-pay | Admitting: Physical Therapy

## 2017-09-18 ENCOUNTER — Ambulatory Visit: Payer: Managed Care, Other (non HMO) | Admitting: Family Medicine

## 2017-09-18 DIAGNOSIS — M6281 Muscle weakness (generalized): Secondary | ICD-10-CM | POA: Diagnosis not present

## 2017-09-18 DIAGNOSIS — M25551 Pain in right hip: Secondary | ICD-10-CM | POA: Diagnosis not present

## 2017-09-18 DIAGNOSIS — R29898 Other symptoms and signs involving the musculoskeletal system: Secondary | ICD-10-CM | POA: Diagnosis not present

## 2017-09-18 NOTE — Therapy (Signed)
[place], [place], [address] Phone: [phone]   Fax:  [phone]  Physical Therapy Treatment  Patient Details  Name: Audrey Greer MRN: [id number] Date of Birth: 19-Apr-1986 Referring Provider: Dr. Georgina Snell    Encounter Date: 09/18/2017  PT End of Session - 09/18/17 0939    Visit Number  6    Number of Visits  12    Date for PT Re-Evaluation  09/24/17    PT Start Time  0930    PT Stop Time  1025 (MHP 12 min)   PT Time Calculation (min)  55 min    Activity Tolerance  Patient limited by fatigue    Behavior During Therapy  [facility] for tasks assessed/performed       Past Medical History:  Diagnosis Date  . Anxiety   . Asthma   . Chronic bronchitis ([place])   . Cystitis   . History of hypertension   . Hypertension   . Hypothyroidism   . Migraine    "a few/year" (10/01/2016)  . Pneumonia    "many many times; most recent was 11/2015" (10/01/2016)  . Polycystic ovarian syndrome     Past Surgical History:  Procedure Laterality Date  . CESAREAN SECTION  2007  . GANGLION CYST EXCISION Left ~ 2011  . HERNIA REPAIR    . LAPAROSCOPIC ASSISTED VENTRAL HERNIA REPAIR  10/01/2016   w'mesh  . LAPAROSCOPIC CHOLECYSTECTOMY  01/2007  . MIDDLE EAR SURGERY Right    "repaired hole in my eardrum from tube"  . TONSILLECTOMY AND ADENOIDECTOMY  1991  . TYMPANOSTOMY TUBE PLACEMENT Bilateral   . WISDOM TOOTH EXTRACTION      There were no vitals filed for this visit.  Subjective Assessment - 09/18/17 0935    Subjective  Arieana reports she was sick and that is why she missed therapy.  she reports she is now able to sleep on Rt side without pain. She was doing better, then fell down stairs on Saturday (4 days ago) and the pain in hip and back has returned.  She has been using ice/heat.      Limitations  Sitting    Patient Stated Goals  decrease pain and see if the hip is the cause of the problems    Currently in  Pain?  Yes    Pain Score  3     Pain Location  Hip    Pain Orientation  Right;Anterior;Lateral;Posterior    Aggravating Factors   prolonged sitting     Pain Relieving Factors  walking, heat, ice         [facility] PT Assessment - 09/18/17 0001      Assessment   Medical Diagnosis  Rt hip bursitis    Referring Provider  Dr. Georgina Snell     Onset Date/Surgical Date  02/11/17    Hand Dominance  Right    Next MD Visit  09/24/17      Observation/Other Assessments   Observations  Pt has increased ER in RLE when resting in supine position       AROM   AROM Assessment Site  Hip    Right/Left Hip  Right;Left    Right Hip External Rotation   42 prone   prone   Right Hip Internal Rotation   31 prone   prone   Left Hip External Rotation   38 prone   prone   Left Hip Internal Rotation   50 prone   prone  Strength   Right Hip ABduction  5/5      Palpation   Palpation comment  tight and tender in Rt piriformis; pt reported "zinging" in ant Rt hip with palpation of origin of piriformis        [facility] Adult PT Treatment/Exercise - 09/18/17 0001      Lumbar Exercises: Stretches   Passive Hamstring Stretch  2 reps;30 seconds supine with strap   supine with strap   Single Knee to Chest Stretch  1 rep;30 seconds    ITB Stretch  2 reps;30 seconds supine with strap   supine with strap   Piriformis Stretch  2 reps;30 seconds      Lumbar Exercises: Aerobic   Stationary Bike  NuStep L4: 5 min       Lumbar Exercises: Supine   Bridge  5 reps with core engaged   with core engaged   Other Supine Lumbar Exercises  single leg bridge with fig 4 opp leg x 10 reps each side (challenging)      Lumbar Exercises: Sidelying   Clam  10 reps with core engaged. 2 sets   with core engaged. 2 sets     Lumbar Exercises: Prone   Other Prone Lumbar Exercises  childs pose with yoga block for head 15 sec, then lateral trunk flexion x 30 sec x 2 reps, 2 sets       Knee/Hip Exercises: Stretches   Other  Knee/Hip Stretches  hip flexor / psoas stretch in high kneeling with arm overhead x 30 sec, x 2 reps.     Other Knee/Hip Stretches  Rt IR hip stretch x 15 sec x 3 reps (shown for HEP)      Moist Heat Therapy   Number Minutes Moist Heat  12 Minutes    Moist Heat Location  Hip Rt   Rt     Iontophoresis   Type of Iontophoresis  Dexamethasone    Location  Rt SI joint    Dose  1.0 cc    Time  80 mA; 6 hr patch                   PT Long Term Goals - 09/18/17 1151      PT LONG TERM GOAL #1   Title  I with advanced HEP ( 09/24/17)     Time  6    Period  Weeks    Status  On-going      PT LONG TERM GOAL #2   Title  improve FOTO =/< 24% limited ( 09/24/17)     Time  6    Period  Weeks    Status  On-going      PT LONG TERM GOAL #3   Title  increase Rt hip strength =/> Lt ( 09/24/17)     Time  6    Period  Weeks    Status  Partially Met      PT LONG TERM GOAL #4   Title  demo strong contraction of TA and multifidi ( 09/24/17)     Time  6    Period  Weeks    Status  Partially Met      PT LONG TERM GOAL #5   Title  report =/> 75% reduction in Rt lower quadrant pain in sitting ( 09/24/17)     Time  6    Period  Weeks    Status  Partially Met  Plan - 09/18/17 0953    Clinical Impression Statement  Pt had recent flare up of symptoms since fall down stairs 4 days ago.  Her Rt hip abduction strength has improved with MMT, however she continues to fatigue quickly with exercise.  Of note, she has decreased Rt hip IR and point tenderness/tightness in Rt piriformis.  Pt had made significant progress prior to re-injury.      Rehab Potential  Good    PT Frequency  2x / week    PT Duration  6 weeks    PT Treatment/Interventions  Moist Heat;Traction;Ultrasound;Therapeutic exercise;Dry needling;Manual techniques;Neuromuscular re-education;Cryotherapy;Electrical Stimulation;Iontophoresis 85m/ml Dexamethasone;Passive range of motion;Patient/family education    PT  Next Visit Plan  assess response to ionto and need for additional sessions vs d/c to HEP (end of POC)    Consulted and Agree with Plan of Care  Patient       Patient will benefit from skilled therapeutic intervention in order to improve the following deficits and impairments:  Increased muscle spasms, Obesity, Pain, Decreased strength  Visit Diagnosis: Pain in right hip  Muscle weakness (generalized)  Other symptoms and signs involving the musculoskeletal system     Problem List Patient Active Problem List   Diagnosis Date Noted  . Panic attack 06/07/2017  . Right thyroid nodule 03/21/2017  . GAD (generalized anxiety disorder) 10/24/2016  . Ventral hernia without obstruction or gangrene 10/01/2016  . Morbid obesity ([place] 04/14/2015  . History of irregular menstrual cycles 05/27/2014  . VBAC, delivered, current hospitalization 12/31/2011  . Third degree perineal laceration, delivered, current hospitalization 12/31/2011  . PCOS (polycystic ovarian syndrome) 10/09/2011  . Hypothyroidism 10/09/2011  . Asthma 10/09/2011  . History of hemolysis, elevated liver enzymes, and low platelet (HELLP) syndrome 10/09/2011   JKerin Perna PTA 09/18/17 11:55 AM  [place] [address]Phone: [phone]  Fax:  [phone] Name: Audrey ENYEARTMRN: [id number] of Birth: 1July 19, 1987

## 2017-09-24 ENCOUNTER — Ambulatory Visit (INDEPENDENT_AMBULATORY_CARE_PROVIDER_SITE_OTHER): Payer: Managed Care, Other (non HMO)

## 2017-09-24 ENCOUNTER — Ambulatory Visit (INDEPENDENT_AMBULATORY_CARE_PROVIDER_SITE_OTHER): Payer: Managed Care, Other (non HMO) | Admitting: Family Medicine

## 2017-09-24 ENCOUNTER — Encounter: Payer: Self-pay | Admitting: Family Medicine

## 2017-09-24 VITALS — BP 134/82 | HR 94 | Temp 98.6°F | Ht 64.0 in | Wt 275.0 lb

## 2017-09-24 DIAGNOSIS — M545 Low back pain, unspecified: Secondary | ICD-10-CM

## 2017-09-24 DIAGNOSIS — M549 Dorsalgia, unspecified: Secondary | ICD-10-CM

## 2017-09-24 DIAGNOSIS — M7061 Trochanteric bursitis, right hip: Secondary | ICD-10-CM

## 2017-09-24 LAB — POCT URINALYSIS DIPSTICK
BILIRUBIN UA: NEGATIVE
Blood, UA: NEGATIVE
Glucose, UA: NEGATIVE
KETONES UA: NEGATIVE
Nitrite, UA: NEGATIVE
PH UA: 7 (ref 5.0–8.0)
Protein, UA: NEGATIVE
SPEC GRAV UA: 1.02 (ref 1.010–1.025)
Urobilinogen, UA: 0.2 E.U./dL

## 2017-09-24 LAB — POCT URINE PREGNANCY: Preg Test, Ur: NEGATIVE

## 2017-09-24 NOTE — Progress Notes (Signed)
Audrey Greer is a 31 y.o. female who presents to [facility] today for follow up right lateral hip pain and discuss new low back.  Audrey Greer was seen about 6 weeks ago for trochanteric bursitis and was referred to PT. She notes that the lateral hip pain significantly improved. However Audrey Greer notes right low back pain with no radiating pain and weakness or numbness. She knows the pain is worse with activity and better with rest.  Additionally lower notes right anterior hip pain. This is been popping up over the last few weeks and seems to be worse with hip flexed positioning. She denies significant pain locking or catching in notes the symptoms are mild to moderate.   Past Medical History:  Diagnosis Date  . Anxiety   . Asthma   . Chronic bronchitis (HCC)   . Cystitis   . History of hypertension   . Hypertension   . Hypothyroidism   . Migraine    "a few/year" (10/01/2016)  . Pneumonia    "many many times; most recent was 11/2015" (10/01/2016)  . Polycystic ovarian syndrome    Past Surgical History:  Procedure Laterality Date  . CESAREAN SECTION  2007  . GANGLION CYST EXCISION Left ~ 2011  . HERNIA REPAIR    . LAPAROSCOPIC ASSISTED VENTRAL HERNIA REPAIR  10/01/2016   w'mesh  . LAPAROSCOPIC CHOLECYSTECTOMY  01/2007  . MIDDLE EAR SURGERY Right    "repaired hole in my eardrum from tube"  . TONSILLECTOMY AND ADENOIDECTOMY  1991  . TYMPANOSTOMY TUBE PLACEMENT Bilateral   . WISDOM TOOTH EXTRACTION     Social History   Tobacco Use  . Smoking status: Never Smoker  . Smokeless tobacco: Never Used  Substance Use Topics  . Alcohol use: Yes    Alcohol/week: 0.0 oz    Comment: 10/01/2016 "might have 1 drink/month, if that"     ROS:  As above   Medications: Current Outpatient Medications  Medication Sig Dispense Refill  . clonazePAM (KLONOPIN) 0.5 MG tablet Take 0.5-1 tablets (0.25-0.5 mg total) by mouth 2 (two) times daily as  needed for anxiety (Use sparingly to avoid dependence). #30 for 90(ninety) days 30 tablet 0  . fluticasone (FLONASE) 50 MCG/ACT nasal spray Place 2 sprays into both nostrils daily. 16 g 1  . levothyroxine (SYNTHROID, LEVOTHROID) 137 MCG tablet Take 137 mcg by mouth daily before breakfast.   0   No current facility-administered medications for this visit.    Allergies  Allergen Reactions  . No Known Allergies      Exam:  BP 134/82   Pulse 94   Temp 98.6 F (37 C) (Oral)   Ht 5\' 4"  (1.626 m)   Wt 275 lb (124.7 kg)   BMI 47.20 kg/m  General: Well Developed, well nourished, and in no acute distress.  Neuro/Psych: Alert and oriented x3, extra-ocular muscles intact, able to move all 4 extremities, sensation grossly intact. Skin: Warm and dry, no rashes noted.  Respiratory: Not using accessory muscles, speaking in full sentences, trachea midline.  Cardiovascular: Pulses palpable, no extremity edema. Abdomen: Does not appear distended. MSK:  L spine: Nontender to spinal midline. Tender to palpation at the right lumbar paraspinal muscles. Back motion is normal but patient does experience pain with extension.  Right hip normal-appearing nontender normal motion pain with flexion and internal motion.  Strength is intact.  Normal gait.    Results for orders placed or performed in visit on 09/24/17 (from the  past 48 hour(s))  POCT Urinalysis Dipstick     Status: Abnormal   Collection Time: 09/24/17  1:50 PM  Result Value Ref Range   Color, UA yllw    Clarity, UA clr    Glucose, UA neg    Bilirubin, UA neg    Ketones, UA neg    Spec Grav, UA 1.020 1.010 - 1.025   Blood, UA neg    pH, UA 7.0 5.0 - 8.0   Protein, UA neg    Urobilinogen, UA 0.2 0.2 or 1.0 E.U./dL   Nitrite, UA neg    Leukocytes, UA Trace (A) Negative  POCT urine pregnancy     Status: None   Collection Time: 09/24/17  2:10 PM  Result Value Ref Range   Preg Test, Ur Negative Negative   Dg Lumbar Spine  Complete  Result Date: 09/24/2017 CLINICAL DATA:  Right-sided lower back pain for 2 months. EXAM: LUMBAR SPINE - COMPLETE 4+ VIEW COMPARISON:  CT scan of November 24, 2014. FINDINGS: There is no evidence of lumbar spine fracture. Alignment is normal. Intervertebral disc spaces are maintained. IMPRESSION: Normal lumbar spine. Electronically Signed   By: Lupita RaiderJames  Green Jr, M.D.   On: 09/24/2017 16:38      Assessment and Plan: 31 y.o. female with  Right lateral hip pain significantly improved with physical therapy. Plan for continued home exercise program and recheck as needed  The back pain is likely due to lumbosacral strain and weakness. I think physical therapy would be a good solution for this. Plan to refer back to physical therapy to work on core strengthening.  Anterior hip pain is possible internal impingement. X-ray in September was unremarkable. Plan for relative rest and if not better will proceed with further workup possibly including an MRI arthrogram.    Orders Placed This Encounter  Procedures  . DG Lumbar Spine Complete    Standing Status:   Future    Number of Occurrences:   1    Standing Expiration Date:   11/24/2018    Order Specific Question:   Reason for Exam (SYMPTOM  OR DIAGNOSIS REQUIRED)    Answer:   eval lspine pain    Order Specific Question:   Is patient pregnant?    Answer:   No    Order Specific Question:   Preferred imaging location?    Answer[facility]   [facility]    Order Specific Question:   Radiology Contrast Protocol - do NOT remove file path    Answer:   [email or web address]  . Ambulatory referral to Physical Therapy    Referral Priority:   Routine    Referral Type:   Physical Medicine    Referral Reason:   Specialty Services Required    Requested Specialty:   Physical Therapy  . POCT Urinalysis Dipstick  . POCT urine pregnancy   No orders of the defined types were placed in this encounter.   Discussed  warning signs or symptoms. Please see discharge instructions. Patient expresses understanding.

## 2017-09-24 NOTE — Patient Instructions (Signed)
Thank you for coming in today. Get xray of the lumbar spine now.  Attend PT.  Recheck in 6 weeks.    Lumbosacral Strain Lumbosacral strain is an injury that causes pain in the lower back (lumbosacral spine). This injury usually occurs from overstretching the muscles or ligaments along your spine. A strain can affect one or more muscles or cord-like tissues that connect bones to other bones (ligaments). What are the causes? This condition may be caused by:  A hard, direct hit (blow) to the back.  Excessive stretching of the lower back muscles. This may result from: ? A fall. ? Lifting something heavy. ? Repetitive movements such as bending or crouching.  What increases the risk? The following factors may increase your risk of getting this condition:  Participating in sports or activities that involve: ? A sudden twist of the back. ? Pushing or pulling motions.  Being overweight or obese.  Having poor strength and flexibility, especially tight hamstrings or weak muscles in the back or abdomen.  Having too much of a curve in the lower back.  Having a pelvis that is tilted forward.  What are the signs or symptoms? The main symptom of this condition is pain in the lower back, at the site of the strain. Pain may extend (radiate) down one or both legs. How is this diagnosed? This condition is diagnosed based on:  Your symptoms.  Your medical history.  A physical exam. ? Your health care provider may push on certain areas of your back to determine the source of your pain. ? You may be asked to bend forward, backward, and side to side to assess the severity of your pain and your range of motion.  Imaging tests, such as: ? X-rays. ? MRI.  How is this treated? Treatment for this condition may include:  Putting heat and cold on the affected area.  Medicines to help relieve pain and relax your muscles (muscle relaxants).  NSAIDs to help reduce swelling and  discomfort.  When your symptoms improve, it is important to gradually return to your normal routine as soon as possible to reduce pain, avoid stiffness, and avoid loss of muscle strength. Generally, symptoms should improve within 6 weeks of treatment. However, recovery time varies. Follow these instructions at home: Managing pain, stiffness, and swelling   If directed, put ice on the injured area during the first 24 hours after your strain. ? Put ice in a plastic bag. ? Place a towel between your skin and the bag. ? Leave the ice on for 20 minutes, 2-3 times a day.  If directed, put heat on the affected area as often as told by your health care provider. Use the heat source that your health care provider recommends, such as a moist heat pack or a heating pad. ? Place a towel between your skin and the heat source. ? Leave the heat on for 20-30 minutes. ? Remove the heat if your skin turns bright red. This is especially important if you are unable to feel pain, heat, or cold. You may have a greater risk of getting burned. Activity  Rest and return to your normal activities as told by your health care provider. Ask your health care provider what activities are safe for you.  Avoid activities that take a lot of energy for as long as told by your health care provider. General instructions  Take over-the-counter and prescription medicines only as told by your health care provider.  Donot drive or  use heavy machinery while taking prescription pain medicine.  Do not use any products that contain nicotine or tobacco, such as cigarettes and e-cigarettes. If you need help quitting, ask your health care provider.  Keep all follow-up visits as told by your health care provider. This is important. How is this prevented?  Use correct form when playing sports and lifting heavy objects.  Use good posture when sitting and standing.  Maintain a healthy weight.  Sleep on a mattress with medium  firmness to support your back.  Be safe and responsible while being active to avoid falls.  Do at least 150 minutes of moderate-intensity exercise each week, such as brisk walking or water aerobics. Try a form of exercise that takes stress off your back, such as swimming or stationary cycling.  Maintain physical fitness, including: ? Strength. ? Flexibility. ? Cardiovascular fitness. ? Endurance. Contact a health care provider if:  Your back pain does not improve after 6 weeks of treatment.  Your symptoms get worse. Get help right away if:  Your back pain is severe.  You cannot stand or walk.  You have difficulty controlling when you urinate or when you have a bowel movement.  You feel nauseous or you vomit.  Your feet get very cold.  You have numbness, tingling, weakness, or problems using your arms or legs.  You develop any of the following: ? Shortness of breath. ? Dizziness. ? Pain in your legs. ? Weakness in your buttocks or legs. ? Discoloration of the skin on your toes or legs. This information is not intended to replace advice given to you by your health care provider. Make sure you discuss any questions you have with your health care provider. Document Released: 08/08/2005 Document Revised: 05/18/2016 Document Reviewed: 04/01/2016 Elsevier Interactive Patient Education  2017 [employer]

## 2017-09-27 ENCOUNTER — Ambulatory Visit (INDEPENDENT_AMBULATORY_CARE_PROVIDER_SITE_OTHER): Payer: Managed Care, Other (non HMO) | Admitting: Physical Therapy

## 2017-09-27 DIAGNOSIS — M6281 Muscle weakness (generalized): Secondary | ICD-10-CM | POA: Diagnosis not present

## 2017-09-27 DIAGNOSIS — R29898 Other symptoms and signs involving the musculoskeletal system: Secondary | ICD-10-CM

## 2017-09-27 DIAGNOSIS — M25551 Pain in right hip: Secondary | ICD-10-CM | POA: Diagnosis not present

## 2017-09-27 NOTE — Patient Instructions (Signed)
Pelvic Press     Place hands under belly between navel and pubic bone, palms up. Feel pressure on hands. Increase pressure on hands by pressing pelvis down. This is NOT a pelvic tilt. Hold __5_ seconds. Relax. Repeat _10__ times. Once a day.  KNEE: Flexion - Prone   Hold pelvic press. Bend knee, then return the foot down. Repeat on opposite leg. Do not raise hips. _10__ reps per set. When this is mastered, pull both heels up at same time, x 10 reps.  Once a day   Leg Lift: One-Leg   Press pelvis down. Keep knee straight; lengthen and lift one leg (from waist). Do not twist body. Keep other leg down. Hold _1__ seconds. Relax.  Clam    Lie on side, legs bent 90. Open top knee to ceiling, rotating leg outward. Touch toes to ankle of bottom leg. Close knees, rotating leg inward. Maintain hip position. Repeat __10__ times, 1-2 sets as tolerated. Repeat on other side. Do __1__ sessions per day.    Repeat 10 time. Repeat with other leg. Trigger Point Dry Needling  . What is Trigger Point Dry Needling (DN)? o DN is a physical therapy technique used to treat muscle pain and dysfunction. Specifically, DN helps deactivate muscle trigger points (muscle knots).  o A thin filiform needle is used to penetrate the skin and stimulate the underlying trigger point. The goal is for a local twitch response (LTR) to occur and for the trigger point to relax. No medication of any kind is injected during the procedure.   . What Does Trigger Point Dry Needling Feel Like?  o The procedure feels different for each individual patient. Some patients report that they do not actually feel the needle enter the skin and overall the process is not painful. Very mild bleeding may occur. However, many patients feel a deep cramping in the muscle in which the needle was inserted. This is the local twitch response.   Marland Kitchen. How Will I feel after the treatment? o Soreness is normal, and the onset of soreness may not occur  for a few hours. Typically this soreness does not last longer than two days.  o Bruising is uncommon, however; ice can be used to decrease any possible bruising.  o In rare cases feeling tired or nauseous after the treatment is normal. In addition, your symptoms may get worse before they get better, this period will typically not last longer than 24 hours.   . What Can I do After My Treatment? o Increase your hydration by drinking more water for the next 24 hours. o You may place ice or heat on the areas treated that have become sore, however, do not use heat on inflamed or bruised areas. Heat often brings more relief post needling. o You can continue your regular activities, but vigorous activity is not recommended initially after the treatment for 24 hours. o DN is best combined with other physical therapy such as strengthening, stretching, and other therapies.   [facility] [address]  [address] [place], [place] [address]  [phone] (office) [phone] (fax)

## 2017-09-27 NOTE — Therapy (Addendum)
[facility] [place], [place], [address] Phone: [phone]   Fax:  [phone]  Physical Therapy Treatment  Patient Details  Name: Audrey Greer MRN: [id number] Date of Birth: 1986-09-21 Referring Provider: Dr. Georgina Snell    Encounter Date: 09/27/2017  PT End of Session - 09/27/17 1021    Visit Number  7    Number of Visits  14   PT Start Time  5537    PT Stop Time  1106    PT Time Calculation (min)  48 min       Past Medical History:  Diagnosis Date  . Anxiety   . Asthma   . Chronic bronchitis ([place])   . Cystitis   . History of hypertension   . Hypertension   . Hypothyroidism   . Migraine    "a few/year" (10/01/2016)  . Pneumonia    "many many times; most recent was 11/2015" (10/01/2016)  . Polycystic ovarian syndrome     Past Surgical History:  Procedure Laterality Date  . CESAREAN SECTION  2007  . GANGLION CYST EXCISION Left ~ 2011  . HERNIA REPAIR    . INSERTION OF MESH N/A 10/01/2016   Performed by Jovita Kussmaul, MD at [place]  . LAPAROSCOPIC ASSISTED VENTRAL HERNIA REPAIR  10/01/2016   w'mesh  . LAPAROSCOPIC CHOLECYSTECTOMY  01/2007  . LAPAROSCOPIC VENTRAL HERNIA WITH MESH N/A 10/01/2016   Performed by Jovita Kussmaul, MD at [place]  . MIDDLE EAR SURGERY Right    "repaired hole in my eardrum from tube"  . TONSILLECTOMY AND ADENOIDECTOMY  1991  . TYMPANOSTOMY TUBE PLACEMENT Bilateral   . WISDOM TOOTH EXTRACTION      There were no vitals filed for this visit.  Subjective Assessment - 09/27/17 1021    Subjective  Pt reports she saw MD; he wants her to do core strengthening.  She has been resting and taking Ibprofen, without any exercise, and her back has calmed down from recent fall down the stairs.  She has used a foam roller propped behind back in her squishy chair to help maintain better position. She is interested in continuing therapy to strengthening her back.   Patient Stated Goals   decrease pain with sitting and activities    Currently in Pain?  No/denies    Pain Score  0-No pain         [facility] PT Assessment - 09/27/17 0001      Assessment   Medical Diagnosis  Rt hip bursitis    Referring Provider  Dr. Georgina Snell     Onset Date/Surgical Date  02/11/17    Hand Dominance  Right    Next MD Visit  10/30/17      Observation/Other Assessments   Focus on Therapeutic Outcomes (FOTO)   40% limited       AROM   Right Hip External Rotation   53    Right Hip Internal Rotation   27    Left Hip External Rotation   35    Left Hip Internal Rotation   50         [facility] Adult PT Treatment/Exercise - 09/27/17 0001      Lumbar Exercises: Stretches   ITB Stretch  2 reps;30 seconds supine, with strap RLE     Piriformis Stretch  1 rep 45 sec, RLE      Lumbar Exercises: Seated   Other Seated Lumbar Exercises  Sitting on Dynadisc:  pelvic tilts  ant/post, side/side, CW/CCW circles x 10 each, within tolerance     Other Seated Lumbar Exercises  Seated TA engagement with hands pressing into thighs x 5 sec holds x 10 reps       Lumbar Exercises: Sidelying   Clam  10 reps;5 reps bilat    Other Sidelying Lumbar Exercises  Rt reverse clams x 10 reps, 2 sets - VC for form.      Lumbar Exercises: Prone   Other Prone Lumbar Exercises  pelvic press x 5 sec, 5 reps; pelvic press with unilateral knee bend x 5 each leg, then with hip ext (knee straight) x 10 reps each leg (limited hip ext on RLE)      Modalities   Modalities  -- pt declined; will use MHP at home.       Manual Therapy   Manual therapy comments  pt in prone    Soft tissue mobilization  STM to post Rt hip, glute, piriformis. TPR to Rt QL, lumbar paraspinals.              PT Education - 09/27/17 1206    Education provided  Yes    Education Details  info on DN,  HEP     Person(s) Educated  Patient    Methods  Explanation;Handout;Verbal cues;Tactile cues;Demonstration    Comprehension  Verbalized  understanding;Returned demonstration          PT Long Term Goals - 09/27/17 1207      PT LONG TERM GOAL #1   Title  I with advanced HEP ( 10/25/17)     Time  4   Period  Weeks    Status  Partially Met      PT LONG TERM GOAL #2   Title  improve FOTO =/< 24% limited ( 10/25/17)     Time  4    Period  Weeks    Status  On-going      PT LONG TERM GOAL #3   Title  increase Rt hip strength =/> Lt ( 10/25/17)     Time  4    Period  Weeks    Status  Partially Met      PT LONG TERM GOAL #4   Title  demo strong contraction of TA and multifidi ( 10/25/17)     Time  4    Period  Weeks    Status  Partially Met improving, difficulty with TA.        PT LONG TERM GOAL #5   Title  report =/> 75% reduction in Rt lower quadrant pain in sitting ( 10/25/17)     Time  4    Period  Weeks    Status  Partially Met dependent upon posture and chair.             Plan - 09/27/17 1041    Clinical Impression Statement  Pt had limited Rt hip ext with prone exercise.  She reports pain in Rt low back with any posterior pelvic tilt, especially sitting with post pelvic tilt.   she had some point tenderness at Rt glute med and piriformis; reduced with TPR manual work. FOTO score decreased since eval to 40% limited.  Pt has partially met her goals and would benefit from continued PT intervention to strengthen core/back to prevent further re-injury.     Rehab Potential  Good    PT Treatment/Interventions  Moist Heat;Traction;Ultrasound;Therapeutic exercise;Dry needling;Manual techniques;Neuromuscular re-education;Cryotherapy;Electrical Stimulation;Iontophoresis '4mg'$ /ml Dexamethasone;Passive range of motion;Patient/family education  PT Next Visit Plan  spoke to supervising PT regarding pt progress; will request additional visits from MD.     Consulted and Agree with Plan of Care  Patient       Patient will benefit from skilled therapeutic intervention in order to improve the following deficits and  impairments:  Increased muscle spasms, Obesity, Pain, Decreased strength  Visit Diagnosis: Pain in right hip  Muscle weakness (generalized)  Other symptoms and signs involving the musculoskeletal system     Problem List Patient Active Problem List   Diagnosis Date Noted  . Panic attack 06/07/2017  . Right thyroid nodule 03/21/2017  . GAD (generalized anxiety disorder) 10/24/2016  . Ventral hernia without obstruction or gangrene 10/01/2016  . Morbid obesity ([place]) 04/14/2015  . History of irregular menstrual cycles 05/27/2014  . VBAC, delivered, current hospitalization 12/31/2011  . Third degree perineal laceration, delivered, current hospitalization 12/31/2011  . PCOS (polycystic ovarian syndrome) 10/09/2011  . Hypothyroidism 10/09/2011  . Asthma 10/09/2011  . History of hemolysis, elevated liver enzymes, and low platelet (HELLP) syndrome 10/09/2011   Kerin Perna, PTA 09/27/17 12:14 PM  [facility] [place], [place], [address] Phone: [phone]   Fax:  [phone]  Name: Audrey Greer MRN: [id number] Date of Birth: 1985/11/24   Jeral Pinch, PT 09/30/17 7:59 AM

## 2017-09-30 NOTE — Addendum Note (Signed)
Addended by: Erabella Kuipers E on: 09/30/2017 07:59 AM   Modules accepted: Orders

## 2017-10-02 ENCOUNTER — Ambulatory Visit (INDEPENDENT_AMBULATORY_CARE_PROVIDER_SITE_OTHER): Payer: Managed Care, Other (non HMO) | Admitting: Physical Therapy

## 2017-10-02 DIAGNOSIS — M6281 Muscle weakness (generalized): Secondary | ICD-10-CM

## 2017-10-02 DIAGNOSIS — R29898 Other symptoms and signs involving the musculoskeletal system: Secondary | ICD-10-CM | POA: Diagnosis not present

## 2017-10-02 DIAGNOSIS — M25551 Pain in right hip: Secondary | ICD-10-CM | POA: Diagnosis not present

## 2017-10-02 NOTE — Patient Instructions (Signed)
Piriformis Stretch, Kneeling Pigeon Pose    From hands and knees, slide one leg backward, turn bent other leg out slightly to side. Resting weight on outside of bent leg, push up torso with arms. If extended hip is elevated place a blanket or towel underneath to relax hip. Hold _15-30__ seconds.  Repeat _2-3__ times per session.

## 2017-10-02 NOTE — Therapy (Signed)
[place], [place], [address] Phone: [phone]   Fax:  [phone]  Physical Therapy Treatment  Patient Details  Name: Audrey Greer MRN: [id number] Date of Birth: [birth year] Referring Provider: Dr. Georgina Snell    Encounter Date: 10/02/2017  PT End of Session - 10/02/17 1021    Visit Number  8    Number of Visits  14    Date for PT Re-Evaluation  10/25/17    PT Start Time  1019    PT Stop Time  1107    PT Time Calculation (min)  48 min       Past Medical History:  Diagnosis Date  . Anxiety   . Asthma   . Chronic bronchitis ([place])   . Cystitis   . History of hypertension   . Hypertension   . Hypothyroidism   . Migraine    "a few/year" (10/01/2016)  . Pneumonia    "many many times; most recent was 11/2015" (10/01/2016)  . Polycystic ovarian syndrome     Past Surgical History:  Procedure Laterality Date  . CESAREAN SECTION  2007  . GANGLION CYST EXCISION Left ~ 2011  . HERNIA REPAIR    . INSERTION OF MESH N/A 10/01/2016   Procedure: INSERTION OF MESH;  Surgeon: Autumn Messing III, MD;  Location: [facility];  Service: General;  Laterality: N/A;  . LAPAROSCOPIC ASSISTED VENTRAL HERNIA REPAIR  10/01/2016   w'mesh  . LAPAROSCOPIC CHOLECYSTECTOMY  01/2007  . MIDDLE EAR SURGERY Right    "repaired hole in my eardrum from tube"  . TONSILLECTOMY AND ADENOIDECTOMY  1991  . TYMPANOSTOMY TUBE PLACEMENT Bilateral   . VENTRAL HERNIA REPAIR N/A 10/01/2016   Procedure: LAPAROSCOPIC VENTRAL HERNIA WITH MESH;  Surgeon: Autumn Messing III, MD;  Location: [facility];  Service: General;  Laterality: N/A;  . WISDOM TOOTH EXTRACTION      There were no vitals filed for this visit.  Subjective Assessment - 10/02/17 1022    Subjective  Latrenda reports she had to travel to [facility] and sit a lot; back pain increased.  she has been trying to walk and do the exercises, "I may have overdone it".  she has been trying to maintain curve  in low back which has helped the tailbone pain she use to have when transitioning to standing.     Currently in Pain?  Yes    Pain Score  4     Pain Location  Hip    Pain Orientation  Right;Posterior    Pain Descriptors / Indicators  Burning    Aggravating Factors   prolonged sitting     Pain Relieving Factors  walking, heat, ice         [facility] PT Assessment - 10/02/17 0001      Assessment   Medical Diagnosis  Rt hip bursitis    Referring Provider  Dr. Georgina Snell     Onset Date/Surgical Date  02/11/17    Hand Dominance  Right    Next MD Visit  10/30/17      Palpation   Palpation comment  tight and tender in Rt piriformis; pt reported "numbness" Rt hip with palpation of origin of piriformis, and tenderness in Rt lumbar paraspinals.        [facility] Adult PT Treatment/Exercise - 10/02/17 0001      Lumbar Exercises: Stretches   Hip Flexor Stretch  30 seconds;4 reps arm overhead, knee on 3" pad  Lumbar Exercises: Aerobic   Tread Mill  5 min @ 2.6 mph      Lumbar Exercises: Standing   Other Standing Lumbar Exercises  Rt hip ext isometric, with foot on door frame, hands on opp side of door frame x 3 sec, 7 reps.       Lumbar Exercises: Seated   Other Seated Lumbar Exercises  Modified pigeon pose x 30 sec x 5 reps (back leg extended off edge of table)      Lumbar Exercises: Prone   Opposite Arm/Leg Raise  Right arm/Left leg;Left arm/Right leg;10 reps;2 seconds    Other Prone Lumbar Exercises  Pelvic press with Rt hip ext (knee straight) x 10 reps, 2 sets; pelvic press with hip ext (knee bent) x 10 each leg.       Modalities   Modalities  -- pt declined; will use MHP at home.       Manual Therapy   Manual therapy comments  Pt in prone and Lt sidelying.     Soft tissue mobilization  STM to post Rt hip, glute, piriformis. TPR to Rt QL, lumbar paraspinals.              PT Education - 10/02/17 1046    Education provided  Yes    Education Details  HEP    Person(s) Educated   Patient    Methods  Explanation;Verbal cues;Demonstration    Comprehension  Returned demonstration;Verbalized understanding          PT Long Term Goals - 09/27/17 1207      PT LONG TERM GOAL #1   Title  I with advanced HEP ( 10/25/17)     Time  4    Period  Weeks    Status  Partially Met      PT LONG TERM GOAL #2   Title  improve FOTO =/< 24% limited ( 10/25/17)     Time  4    Period  Weeks    Status  On-going      PT LONG TERM GOAL #3   Title  increase Rt hip strength =/> Lt ( 10/25/17)     Time  4    Period  Weeks    Status  Partially Met      PT LONG TERM GOAL #4   Title  demo strong contraction of TA and multifidi ( 10/25/17)     Time  4    Period  Weeks    Status  Partially Met improving, difficulty with TA.        PT LONG TERM GOAL #5   Title  report =/> 75% reduction in Rt lower quadrant pain in sitting ( 10/25/17)     Time  4    Period  Weeks    Status  Partially Met dependent upon posture and chair.             Plan - 10/02/17 1048    Clinical Impression Statement  Pt demonstrated improved Rt hip ext with prone exercise today; pt reported it felt easier than last wk.  She began to have pain in Rt hip with LLE prone hip ext.  This was reduced with stretches. Pt may benefit from DN to Rt hip and lumbar spinal mobs next visit.     Rehab Potential  Good    PT Frequency  2x / week    PT Duration  6 weeks    PT Treatment/Interventions  Moist Heat;Traction;Ultrasound;Therapeutic exercise;Dry needling;Manual techniques;Neuromuscular re-education;Cryotherapy;Dealer  Stimulation;Iontophoresis '4mg'$ /ml Dexamethasone;Passive range of motion;Patient/family education    PT Next Visit Plan  DN/lumbar mobs.  continue core and Rt hip strengthening.     Consulted and Agree with Plan of Care  Patient       Patient will benefit from skilled therapeutic intervention in order to improve the following deficits and impairments:  Increased muscle spasms, Obesity, Pain,  Decreased strength  Visit Diagnosis: Pain in right hip  Muscle weakness (generalized)  Other symptoms and signs involving the musculoskeletal system     Problem List Patient Active Problem List   Diagnosis Date Noted  . Panic attack 06/07/2017  . Right thyroid nodule 03/21/2017  . GAD (generalized anxiety disorder) 10/24/2016  . Ventral hernia without obstruction or gangrene 10/01/2016  . Morbid obesity ([place]) 04/14/2015  . History of irregular menstrual cycles 05/27/2014  . VBAC, delivered, current hospitalization 12/31/2011  . Third degree perineal laceration, delivered, current hospitalization 12/31/2011  . PCOS (polycystic ovarian syndrome) 10/09/2011  . Hypothyroidism 10/09/2011  . Asthma 10/09/2011  . History of hemolysis, elevated liver enzymes, and low platelet (HELLP) syndrome 10/09/2011   Kerin Perna, PTA 10/02/17 12:12 PM  [place], [place], [address] Phone: [phone]   Fax:  [phone]  Name: IVANA NICASTRO MRN: [id number] Date of Birth: Nov 03, 1986

## 2017-10-09 ENCOUNTER — Ambulatory Visit (INDEPENDENT_AMBULATORY_CARE_PROVIDER_SITE_OTHER): Payer: Managed Care, Other (non HMO) | Admitting: Rehabilitative and Restorative Service Providers"

## 2017-10-09 ENCOUNTER — Encounter: Payer: Self-pay | Admitting: Rehabilitative and Restorative Service Providers"

## 2017-10-09 DIAGNOSIS — R29898 Other symptoms and signs involving the musculoskeletal system: Secondary | ICD-10-CM

## 2017-10-09 DIAGNOSIS — M25551 Pain in right hip: Secondary | ICD-10-CM

## 2017-10-09 DIAGNOSIS — M6281 Muscle weakness (generalized): Secondary | ICD-10-CM

## 2017-10-09 NOTE — Therapy (Signed)
[place], [place], [address] Phone: [phone]   Fax:  [phone]  Physical Therapy Treatment  Patient Details  Name: Audrey Greer MRN: [id number] Date of Birth: October 28, 1986 Referring Provider: Dr. Georgina Snell    Encounter Date: 10/09/2017  PT End of Session - 10/09/17 1403    Visit Number  9    Number of Visits  14    Date for PT Re-Evaluation  10/25/17    PT Start Time  6503    PT Stop Time  1459    PT Time Calculation (min)  54 min    Activity Tolerance  Patient tolerated treatment well       Past Medical History:  Diagnosis Date  . Anxiety   . Asthma   . Chronic bronchitis ([place])   . Cystitis   . History of hypertension   . Hypertension   . Hypothyroidism   . Migraine    "a few/year" (10/01/2016)  . Pneumonia    "many many times; most recent was 11/2015" (10/01/2016)  . Polycystic ovarian syndrome     Past Surgical History:  Procedure Laterality Date  . CESAREAN SECTION  2007  . GANGLION CYST EXCISION Left ~ 2011  . HERNIA REPAIR    . INSERTION OF MESH N/A 10/01/2016   Procedure: INSERTION OF MESH;  Surgeon: Autumn Messing III, MD;  Location: [facility];  Service: General;  Laterality: N/A;  . LAPAROSCOPIC ASSISTED VENTRAL HERNIA REPAIR  10/01/2016   w'mesh  . LAPAROSCOPIC CHOLECYSTECTOMY  01/2007  . MIDDLE EAR SURGERY Right    "repaired hole in my eardrum from tube"  . TONSILLECTOMY AND ADENOIDECTOMY  1991  . TYMPANOSTOMY TUBE PLACEMENT Bilateral   . VENTRAL HERNIA REPAIR N/A 10/01/2016   Procedure: LAPAROSCOPIC VENTRAL HERNIA WITH MESH;  Surgeon: Autumn Messing III, MD;  Location: [facility];  Service: General;  Laterality: N/A;  . WISDOM TOOTH EXTRACTION      There were no vitals filed for this visit.  Subjective Assessment - 10/09/17 1407    Subjective  Kaniah reports that her Rt hip is better than last week but about the same as it has been. She is workingon her exercises at home.     Currently in Pain?  Yes    Pain Score  1  Pain increases with sitting; lying down     Pain Location  Hip    Pain Orientation  Right;Posterior    Pain Descriptors / Indicators  Sore    Pain Type  Chronic pain    Pain Onset  More than a month ago    Pain Frequency  Constant in back and top of hip flexors     Aggravating Factors   prolonged sitting; lying down                      [facility] Adult PT Treatment/Exercise - 10/09/17 0001      Lumbar Exercises: Stretches   Hip Flexor Stretch  30 seconds;4 reps arm overhead, knee on 3" pad      Lumbar Exercises: Aerobic   Stationary Bike  NuStep L4: 5 min       Moist Heat Therapy   Number Minutes Moist Heat  15 Minutes    Moist Heat Location  Lumbar Spine;Hip Rt hip       Manual Therapy   Manual therapy comments  pt prone     Soft tissue mobilization  deep tissue work through  Rt lumbar and hip musculature        Trigger Point Dry Needling - 10/09/17 1452    Consent Given?  Yes    Education Handout Provided  Yes    Muscles Treated Upper Body  Longissimus;Quadratus Lumborum    Muscles Treated Lower Body  -- Rt lumbar/hip     Longissimus Response  Palpable increased muscle length    Gluteus Maximus Response  Palpable increased muscle length    Gluteus Minimus Response  Palpable increased muscle length    Piriformis Response  Palpable increased muscle length       Patient received moist heat post treatment for 15 min - non-billable charge     PT Education - 10/09/17 1453    Education provided  Yes    Education Details  DN    Person(s) Educated  Patient    Methods  Explanation;Handout    Comprehension  Verbalized understanding          PT Long Term Goals - 10/09/17 1458      PT LONG TERM GOAL #1   Title  I with advanced HEP ( 10/25/17)     Time  4    Period  Weeks    Status  Partially Met      PT LONG TERM GOAL #2   Title  improve FOTO =/< 24% limited ( 10/25/17)     Time  4    Period  Weeks    Status   On-going      PT LONG TERM GOAL #3   Title  increase Rt hip strength =/> Lt ( 10/25/17)     Time  4    Period  Weeks    Status  Partially Met      PT LONG TERM GOAL #4   Title  demo strong contraction of TA and multifidi ( 10/25/17)     Time  4    Period  Weeks    Status  Partially Met      PT LONG TERM GOAL #5   Title  report =/> 75% reduction in Rt lower quadrant pain in sitting ( 10/25/17)     Time  4    Period  Weeks    Status  Partially Met            Plan - 10/09/17 1454    Clinical Impression Statement  Tehilla experienced some anxiety with DN and did not tolerate any pistioning of needles and declined extim with DN. She did have some improvement in tissue tightness with manual work following DN but reported soreness. Will assess response to DN. continue with exercise.     Rehab Potential  Good    PT Frequency  2x / week    PT Duration  6 weeks    PT Treatment/Interventions  Moist Heat;Traction;Ultrasound;Therapeutic exercise;Dry needling;Manual techniques;Neuromuscular re-education;Cryotherapy;Electrical Stimulation;Iontophoresis 62m/ml Dexamethasone;Passive range of motion;Patient/family education    PT Next Visit Plan  assess response to DN/lumbar mobs.  continue core and Rt hip strengthening.     Consulted and Agree with Plan of Care  Patient       Patient will benefit from skilled therapeutic intervention in order to improve the following deficits and impairments:  Increased muscle spasms, Obesity, Pain, Decreased strength  Visit Diagnosis: Pain in right hip  Muscle weakness (generalized)  Other symptoms and signs involving the musculoskeletal system     Problem List Patient Active Problem List   Diagnosis Date Noted  . Panic attack 06/07/2017  .  Right thyroid nodule 03/21/2017  . GAD (generalized anxiety disorder) 10/24/2016  . Ventral hernia without obstruction or gangrene 10/01/2016  . Morbid obesity ([place]) 04/14/2015  . History of irregular  menstrual cycles 05/27/2014  . VBAC, delivered, current hospitalization 12/31/2011  . Third degree perineal laceration, delivered, current hospitalization 12/31/2011  . PCOS (polycystic ovarian syndrome) 10/09/2011  . Hypothyroidism 10/09/2011  . Asthma 10/09/2011  . History of hemolysis, elevated liver enzymes, and low platelet (HELLP) syndrome 10/09/2011    Celyn Nilda Simmer PT, MPH  10/09/2017, 2:59 PM  [facility] [place], [place], [address] Phone: [phone]   Fax:  [phone]  Name: HENRINE HAYTER MRN: [id number] Date of Birth: [birth year]

## 2017-10-09 NOTE — Patient Instructions (Signed)

## 2017-10-11 ENCOUNTER — Ambulatory Visit (INDEPENDENT_AMBULATORY_CARE_PROVIDER_SITE_OTHER): Payer: Managed Care, Other (non HMO) | Admitting: Physical Therapy

## 2017-10-11 ENCOUNTER — Encounter: Payer: Self-pay | Admitting: Physical Therapy

## 2017-10-11 DIAGNOSIS — M6281 Muscle weakness (generalized): Secondary | ICD-10-CM

## 2017-10-11 DIAGNOSIS — M25551 Pain in right hip: Secondary | ICD-10-CM | POA: Diagnosis not present

## 2017-10-11 DIAGNOSIS — R29898 Other symptoms and signs involving the musculoskeletal system: Secondary | ICD-10-CM | POA: Diagnosis not present

## 2017-10-11 NOTE — Therapy (Signed)
[place], [place], [address] Phone: [phone]   Fax:  [phone]  Physical Therapy Treatment  Patient Details  Name: Audrey Greer MRN: [id number] Date of Birth: 08/19/1986 Referring Provider: Dr. Georgina Snell    Encounter Date: 10/11/2017  PT End of Session - 10/11/17 0849    Visit Number  10    Number of Visits  14    Date for PT Re-Evaluation  10/25/17    PT Start Time  0846    PT Stop Time  0927    PT Time Calculation (min)  41 min    Activity Tolerance  Patient tolerated treatment well;No increased pain    Behavior During Therapy  WFL for tasks assessed/performed       Past Medical History:  Diagnosis Date  . Anxiety   . Asthma   . Chronic bronchitis ([place])   . Cystitis   . History of hypertension   . Hypertension   . Hypothyroidism   . Migraine    "a few/year" (10/01/2016)  . Pneumonia    "many many times; most recent was 11/2015" (10/01/2016)  . Polycystic ovarian syndrome     Past Surgical History:  Procedure Laterality Date  . CESAREAN SECTION  2007  . GANGLION CYST EXCISION Left ~ 2011  . HERNIA REPAIR    . INSERTION OF MESH N/A 10/01/2016   Procedure: INSERTION OF MESH;  Surgeon: Autumn Messing III, MD;  Location: [facility];  Service: General;  Laterality: N/A;  . LAPAROSCOPIC ASSISTED VENTRAL HERNIA REPAIR  10/01/2016   w'mesh  . LAPAROSCOPIC CHOLECYSTECTOMY  01/2007  . MIDDLE EAR SURGERY Right    "repaired hole in my eardrum from tube"  . TONSILLECTOMY AND ADENOIDECTOMY  1991  . TYMPANOSTOMY TUBE PLACEMENT Bilateral   . VENTRAL HERNIA REPAIR N/A 10/01/2016   Procedure: LAPAROSCOPIC VENTRAL HERNIA WITH MESH;  Surgeon: Autumn Messing III, MD;  Location: [facility];  Service: General;  Laterality: N/A;  . WISDOM TOOTH EXTRACTION      There were no vitals filed for this visit.  Subjective Assessment - 10/11/17 0849    Subjective  Audrey Greer reports the DN was very painful and she was sore  in her Rt hip/back the next day. She reports a big difference in reduction of tightness.  She awoke this morning with no pain. She is interested in continued strengthening.  she is interested in doing yoga at home and getting a treadmill.     Patient Stated Goals  decrease pain with sitting and activities    Currently in Pain?  No/denies    Pain Score  0-No pain         [facility] PT Assessment - 10/11/17 0001      Assessment   Medical Diagnosis  Rt hip bursitis    Referring Provider  Dr. Georgina Snell     Onset Date/Surgical Date  02/11/17    Hand Dominance  Right    Next MD Visit  10/30/17        [facility] Adult PT Treatment/Exercise - 10/11/17 0001      Lumbar Exercises: Stretches   Passive Hamstring Stretch  1 rep;20 seconds RLE    Lower Trunk Rotation  2 reps;20 seconds no pain    Hip Flexor Stretch  30 seconds;4 reps arm overhead, knee on 3" pad    ITB Stretch  1 rep;20 seconds RLE, supine with strap      Lumbar Exercises: Aerobic   [place]  5 min @ 2.5 mph      Lumbar Exercises: Seated   Other Seated Lumbar Exercises  Modified pigeon pose x 30 sec RLE      Lumbar Exercises: Supine   Other Supine Lumbar Exercises  beginner pilates hundred exercise:(hooklying pulsing arms to ~25) x 3 reps      Lumbar Exercises: Sidelying   Other Sidelying Lumbar Exercises  Rt reverse and reg clams x 10 reps - each exercise, each leg, with yellow band at ankles/thighs.       Lumbar Exercises: Prone   Other Prone Lumbar Exercises  plank x 5 sec, modified plank 10 sec x 2 reps (on knees and hands)       Lumbar Exercises: Quadruped   Other Quadruped Lumbar Exercises  In proposal pose:  X pattern with weighted ball (2#, 4#) x 5 reps each foot forward, each ball; repeated in high kneeling.   High kneeling:  4# ball in and out from core x 10 reps    Other Quadruped Lumbar Exercises  childs pose with Lt lateral trunk flexion x 20 sec x 2 reps      Modalities   Modalities  -- pt declined; will use MHP at  home.              PT Education - 10/11/17 0907    Education provided  Yes    Education Details  pt issued yellow band to perform clams/reverse clams    Person(s) Educated  Patient    Methods  Explanation    Comprehension  Verbalized understanding          PT Long Term Goals - 10/11/17 0909      PT LONG TERM GOAL #1   Title  I with advanced HEP ( 10/25/17)     Time  4    Period  Weeks    Status  Partially Met      PT LONG TERM GOAL #2   Title  improve FOTO =/< 24% limited ( 10/25/17)     Time  4    Period  Weeks    Status  On-going      PT LONG TERM GOAL #3   Title  increase Rt hip strength =/> Lt ( 10/25/17)     Time  4    Period  Weeks    Status  Partially Met      PT LONG TERM GOAL #4   Title  demo strong contraction of TA and multifidi ( 10/25/17)     Time  4    Period  Weeks    Status  Partially Met      PT LONG TERM GOAL #5   Title  report =/> 75% reduction in Rt lower quadrant pain in sitting ( 10/25/17)     Time  4    Period  Weeks    Status  -- 50% reported 11/30.            Plan - 10/11/17 0925    Clinical Impression Statement  Pt had positive response with DN; reported reduction of tissue tightness and pain. She tolerated new exercises with increased difficulty with min increase in symptoms. Pt progressing well towards established goals.     Rehab Potential  Good    PT Frequency  2x / week    PT Duration  6 weeks    PT Treatment/Interventions  Moist Heat;Traction;Ultrasound;Therapeutic exercise;Dry needling;Manual techniques;Neuromuscular re-education;Cryotherapy;Electrical Stimulation;Iontophoresis 44m/ml Dexamethasone;Passive range of motion;Patient/family education  PT Next Visit Plan  continue progressive core strengthening.     Consulted and Agree with Plan of Care  Patient       Patient will benefit from skilled therapeutic intervention in order to improve the following deficits and impairments:  Increased muscle spasms,  Obesity, Pain, Decreased strength  Visit Diagnosis: Pain in right hip  Muscle weakness (generalized)  Other symptoms and signs involving the musculoskeletal system     Problem List Patient Active Problem List   Diagnosis Date Noted  . Panic attack 06/07/2017  . Right thyroid nodule 03/21/2017  . GAD (generalized anxiety disorder) 10/24/2016  . Ventral hernia without obstruction or gangrene 10/01/2016  . Morbid obesity ([place]) 04/14/2015  . History of irregular menstrual cycles 05/27/2014  . VBAC, delivered, current hospitalization 12/31/2011  . Third degree perineal laceration, delivered, current hospitalization 12/31/2011  . PCOS (polycystic ovarian syndrome) 10/09/2011  . Hypothyroidism 10/09/2011  . Asthma 10/09/2011  . History of hemolysis, elevated liver enzymes, and low platelet (HELLP) syndrome 10/09/2011   Kerin Perna, PTA 10/11/17 9:29 AM  [facility] [place], [place], [address] Phone: [phone]   Fax:  [phone]  Name: Audrey Greer MRN: [id number] Date of Birth: Jul 20, 1986

## 2017-10-15 ENCOUNTER — Ambulatory Visit (INDEPENDENT_AMBULATORY_CARE_PROVIDER_SITE_OTHER): Payer: Managed Care, Other (non HMO) | Admitting: Physical Therapy

## 2017-10-15 ENCOUNTER — Encounter: Payer: Self-pay | Admitting: Physical Therapy

## 2017-10-15 DIAGNOSIS — M6281 Muscle weakness (generalized): Secondary | ICD-10-CM

## 2017-10-15 DIAGNOSIS — R29898 Other symptoms and signs involving the musculoskeletal system: Secondary | ICD-10-CM | POA: Diagnosis not present

## 2017-10-15 DIAGNOSIS — M25551 Pain in right hip: Secondary | ICD-10-CM | POA: Diagnosis not present

## 2017-10-15 NOTE — Therapy (Addendum)
[facility] [place], [place], [address] Phone: [phone]   Fax:  [phone]  Physical Therapy Treatment  Patient Details  Name: Audrey Greer MRN: [id number] Date of Birth: [birth year] Referring Provider: Dr. Georgina Snell    Encounter Date: 10/15/2017  PT End of Session - 10/15/17 1222    Visit Number  11    Number of Visits  14    Date for PT Re-Evaluation  10/25/17    PT Start Time  7628    PT Stop Time  3151    PT Time Calculation (min)  39 min       Past Medical History:  Diagnosis Date  . Anxiety   . Asthma   . Chronic bronchitis ([place])   . Cystitis   . History of hypertension   . Hypertension   . Hypothyroidism   . Migraine    "a few/year" (10/01/2016)  . Pneumonia    "many many times; most recent was 11/2015" (10/01/2016)  . Polycystic ovarian syndrome     Past Surgical History:  Procedure Laterality Date  . CESAREAN SECTION  2007  . GANGLION CYST EXCISION Left ~ 2011  . HERNIA REPAIR    . INSERTION OF MESH N/A 10/01/2016   Procedure: INSERTION OF MESH;  Surgeon: Autumn Messing III, MD;  Location: [facility];  Service: General;  Laterality: N/A;  . LAPAROSCOPIC ASSISTED VENTRAL HERNIA REPAIR  10/01/2016   w'mesh  . LAPAROSCOPIC CHOLECYSTECTOMY  01/2007  . MIDDLE EAR SURGERY Right    "repaired hole in my eardrum from tube"  . TONSILLECTOMY AND ADENOIDECTOMY  1991  . TYMPANOSTOMY TUBE PLACEMENT Bilateral   . VENTRAL HERNIA REPAIR N/A 10/01/2016   Procedure: LAPAROSCOPIC VENTRAL HERNIA WITH MESH;  Surgeon: Autumn Messing III, MD;  Location: [facility];  Service: General;  Laterality: N/A;  . WISDOM TOOTH EXTRACTION      There were no vitals filed for this visit.  Subjective Assessment - 10/15/17 1252    Subjective  Pt reports she has occasional pains in the Rt ant hip, but it is easily resolved now with stretches and heat.  She feels she is getting stronger overall.     Patient Stated Goals  decrease pain  with sitting and activities    Currently in Pain?  No/denies    Pain Score  0-No pain         [facility] PT Assessment - 10/15/17 0001      Assessment   Medical Diagnosis  Rt hip bursitis    Referring Provider  Dr. Georgina Snell     Onset Date/Surgical Date  02/11/17    Hand Dominance  Right    Next MD Visit  10/30/17      Observation/Other Assessments   Focus on Therapeutic Outcomes (FOTO)   29% limited      Strength   Right Hip ABduction  5/5       [facility] Adult PT Treatment/Exercise - 10/15/17 0001      Lumbar Exercises: Stretches   Hip Flexor Stretch  30 seconds;4 reps arm overhead, knee on 3" pad      Lumbar Exercises: Aerobic   Tread Mill  5 min @ 2.5 mph      Lumbar Exercises: Standing   Other Standing Lumbar Exercises  wood chop x 10 reps each side with red band; reverse wood chop with red band x 10 reps each side.       Lumbar Exercises: Seated   Other  Seated Lumbar Exercises  Modified pigeon pose x 30 sec RLE     Other Seated Lumbar Exercises  seated reverse wood chop x 3 reps (for HEP)       Lumbar Exercises: Sidelying   Clam  10 reps each side - with red band at thighs    Other Sidelying Lumbar Exercises  Rt reverse clams x 10 reps with red band at ankles, 2 sets each leg.     Other Sidelying Lumbar Exercises  pilates  hot potato (arches with tap in front and back) x 10 reps with RLE.       Lumbar Exercises: Quadruped   Plank  modified plank with knees on 3" pad, on hands x 12 sec, no pain.     Other Quadruped Lumbar Exercises  childs pose with lateral trunk flexion x 20 sec x 2 reps                  PT Long Term Goals - 10/15/17 1258      PT LONG TERM GOAL #1   Title  I with advanced HEP ( 10/25/17)     Time  4    Period  Weeks    Status  Partially Met      PT LONG TERM GOAL #2   Title  improve FOTO =/< 24% limited ( 10/25/17)     Time  4    Period  Weeks    Status  On-going      PT LONG TERM GOAL #3   Title  increase Rt hip strength =/> Lt (  10/25/17)     Time  4    Period  Weeks    Status  Achieved      PT LONG TERM GOAL #4   Title  demo strong contraction of TA and multifidi ( 10/25/17)     Time  4    Period  Weeks    Status  Partially Met      PT LONG TERM GOAL #5   Title  report =/> 75% reduction in Rt lower quadrant pain in sitting ( 10/25/17)     Time  4    Period  Weeks    Status  Partially Met            Plan - 10/15/17 1247    Clinical Impression Statement  FOTO score has improved from 40% to 29% limited. Pt was able to tolerated new core exercises without difficulty or increase in symptoms.  She tolerated clams with increased resistance.  Pt reported only minor discomfort at end of session in her Rt post hip.  Pt making good progress towards remaining goals.  Pt traveling to [facility] and will be gone for a wk.     Rehab Potential  Good    PT Frequency  2x / week    PT Duration  6 weeks    PT Treatment/Interventions  Moist Heat;Traction;Ultrasound;Therapeutic exercise;Dry needling;Manual techniques;Neuromuscular re-education;Cryotherapy;Electrical Stimulation;Iontophoresis 63m/ml Dexamethasone;Passive range of motion;Patient/family education    PT Next Visit Plan  Advance HEP as tolerated.  assess readiness to d/c to HEP.     Consulted and Agree with Plan of Care  Patient       Patient will benefit from skilled therapeutic intervention in order to improve the following deficits and impairments:  Increased muscle spasms, Obesity, Pain, Decreased strength  Visit Diagnosis: Pain in right hip  Muscle weakness (generalized)  Other symptoms and signs involving the musculoskeletal system  Problem List Patient Active Problem List   Diagnosis Date Noted  . Panic attack 06/07/2017  . Right thyroid nodule 03/21/2017  . GAD (generalized anxiety disorder) 10/24/2016  . Ventral hernia without obstruction or gangrene 10/01/2016  . Morbid obesity ([place]) 04/14/2015  . History of irregular menstrual cycles  05/27/2014  . VBAC, delivered, current hospitalization 12/31/2011  . Third degree perineal laceration, delivered, current hospitalization 12/31/2011  . PCOS (polycystic ovarian syndrome) 10/09/2011  . Hypothyroidism 10/09/2011  . Asthma 10/09/2011  . History of hemolysis, elevated liver enzymes, and low platelet (HELLP) syndrome 10/09/2011   Kerin Perna, PTA 10/15/17 12:58 PM  [facility] [place], [place], [address] Phone: [phone]   Fax:  [phone]  Name: Audrey Greer MRN: [id number] Date of Birth: Nov 24, 1985   PHYSICAL THERAPY DISCHARGE SUMMARY  Visits from Start of Care: 11  Current functional level related to goals / functional outcomes: Unknown, Pt was placed on hold and has not returned   Remaining deficits: unknown   Education / Equipment: HEP Plan: Patient agrees to discharge.  Patient goals were partially met. Patient is being discharged due to not returning since the last visit. after being placed on hold ?????  Jeral Pinch, PT 12/09/17 9:14 AM

## 2017-10-15 NOTE — Patient Instructions (Signed)
Wood Chop: Reverse - Sitting (Cable)    Grasp cable and rotate trunk by bringing hands above opposite shoulder. Keep pelvis stable. Repeat to other side. Do ___1-2_ sets. Complete _10___ repetitions.  Wood Chop: Sitting (Cable)    Grasp cable and rotate trunk by bringing hands toward opposite hip. Keep pelvis stable. Repeat to other side. Do _1-2__ sets. Complete __10__ repetitions.   [facility] [address] [place], [place] [address]  [phone] (office) [phone] (fax)

## 2017-10-18 ENCOUNTER — Encounter: Payer: Managed Care, Other (non HMO) | Admitting: Physical Therapy

## 2017-10-29 ENCOUNTER — Encounter: Payer: Managed Care, Other (non HMO) | Admitting: Physical Therapy

## 2017-10-30 ENCOUNTER — Ambulatory Visit: Payer: Managed Care, Other (non HMO) | Admitting: Family Medicine

## 2017-10-30 DIAGNOSIS — Z0189 Encounter for other specified special examinations: Secondary | ICD-10-CM

## 2017-11-13 ENCOUNTER — Encounter: Payer: Self-pay | Admitting: Family Medicine

## 2017-11-13 ENCOUNTER — Ambulatory Visit (INDEPENDENT_AMBULATORY_CARE_PROVIDER_SITE_OTHER): Payer: Managed Care, Other (non HMO) | Admitting: Family Medicine

## 2017-11-13 ENCOUNTER — Ambulatory Visit (INDEPENDENT_AMBULATORY_CARE_PROVIDER_SITE_OTHER): Payer: Managed Care, Other (non HMO)

## 2017-11-13 ENCOUNTER — Other Ambulatory Visit: Payer: Self-pay | Admitting: Family Medicine

## 2017-11-13 VITALS — BP 135/83 | HR 80 | Ht 64.0 in | Wt 281.0 lb

## 2017-11-13 DIAGNOSIS — E282 Polycystic ovarian syndrome: Secondary | ICD-10-CM

## 2017-11-13 DIAGNOSIS — K760 Fatty (change of) liver, not elsewhere classified: Secondary | ICD-10-CM | POA: Diagnosis not present

## 2017-11-13 DIAGNOSIS — R1011 Right upper quadrant pain: Secondary | ICD-10-CM | POA: Diagnosis not present

## 2017-11-13 DIAGNOSIS — N8302 Follicular cyst of left ovary: Secondary | ICD-10-CM

## 2017-11-13 DIAGNOSIS — R1031 Right lower quadrant pain: Secondary | ICD-10-CM | POA: Diagnosis not present

## 2017-11-13 LAB — COMPLETE METABOLIC PANEL WITH GFR
AG Ratio: 1.6 (calc) (ref 1.0–2.5)
ALBUMIN MSPROF: 4.2 g/dL (ref 3.6–5.1)
ALT: 13 U/L (ref 6–29)
AST: 12 U/L (ref 10–30)
Alkaline phosphatase (APISO): 57 U/L (ref 33–115)
BILIRUBIN TOTAL: 0.5 mg/dL (ref 0.2–1.2)
BUN: 9 mg/dL (ref 7–25)
CALCIUM: 9.5 mg/dL (ref 8.6–10.2)
CHLORIDE: 103 mmol/L (ref 98–110)
CO2: 29 mmol/L (ref 20–32)
CREATININE: 0.63 mg/dL (ref 0.50–1.10)
GFR, EST AFRICAN AMERICAN: 139 mL/min/{1.73_m2} (ref >=60)
GFR, Est Non African American: 120 mL/min/{1.73_m2} (ref >=60)
GLUCOSE: 82 mg/dL (ref 65–99)
Globulin: 2.6 g/dL (calc) (ref 1.9–3.7)
Potassium: 4.3 mmol/L (ref 3.5–5.3)
Sodium: 138 mmol/L (ref 135–146)
TOTAL PROTEIN: 6.8 g/dL (ref 6.1–8.1)

## 2017-11-13 LAB — CBC WITH DIFFERENTIAL/PLATELET
BASOS ABS: 42 {cells}/uL (ref 0–200)
Basophils Relative: 0.4 %
EOS ABS: 156 {cells}/uL (ref 15–500)
EOS PCT: 1.5 %
HCT: 38.9 % (ref 35.0–45.0)
Hemoglobin: 13 g/dL (ref 11.7–15.5)
Lymphs Abs: 1810 cells/uL (ref 850–3900)
MCH: 28.1 pg (ref 27.0–33.0)
MCHC: 33.4 g/dL (ref 32.0–36.0)
MCV: 84 fL (ref 80.0–100.0)
MPV: 11.6 fL (ref 7.5–12.5)
Monocytes Relative: 4.1 %
NEUTROS PCT: 76.6 %
Neutro Abs: 7966 cells/uL — ABNORMAL HIGH (ref 1500–7800)
Platelets: 276 10*3/uL (ref 140–400)
RBC: 4.63 10*6/uL (ref 3.80–5.10)
RDW: 13.1 % (ref 11.0–15.0)
TOTAL LYMPHOCYTE: 17.4 %
WBC mixed population: 426 cells/uL (ref 200–950)
WBC: 10.4 10*3/uL (ref 3.8–10.8)

## 2017-11-13 LAB — LIPASE: Lipase: 20 U/L (ref 7–60)

## 2017-11-13 NOTE — Progress Notes (Signed)
Audrey Greer is a 32 y.o. female who presents to [facility] today for hip pain and abdominal pain.  I have seen Audrey Greer several times for hip pain on the right side.  This is associated with low back pain as well.  She had a long course of physical therapy and notes that her back and hip pain has nearly completely resolved.  She was able to go to [employer] and do a lot of walking without pain which was her goal.  She however notes right lower pelvis pain and right upper quadrant abdominal pain  The right lower pelvis pain has been ongoing for several months.  It does not seem to be worse with positioning or food.  She denies any vomiting or diarrhea.  She has a history of PCOS and has a CT scan from about 7 months ago showing a 3 cm right ovarian cyst.  She denies any vaginal discharge or bleeding.  Additionally Audrey Greer notes pain in her right upper quadrant of her abdomen.  This is been ongoing for about a week and is worse with trunk flexion.  She denies any trouble breathing or chest pain.  She denies any change with food.  She has a pertinent medical history for steatohepatitis and a laparoscopic cholecystectomy about 10 years ago.  She again denies any vomiting or diarrhea.   Past Medical History:  Diagnosis Date  . Anxiety   . Asthma   . Chronic bronchitis (H[facility])   . Cystitis   . History of hypertension   . Hypertension   . Hypothyroidism   . Migraine    "a few/year" (10/01/2016)  . Pneumonia    "many many times; most recent was 11/2015" (10/01/2016)  . Polycystic ovarian syndrome    Past Surgical History:  Procedure Laterality Date  . CESAREAN SECTION  2007  . GANGLION CYST EXCISION Left ~ 2011  . HERNIA REPAIR    . INSERTION OF MESH N/A 10/01/2016   Procedure: INSERTION OF MESH;  Surgeon: Chevis Pretty III, MD;  [facility];  Service: General;   Laterality: N/A;  . LAPAROSCOPIC ASSISTED VENTRAL HERNIA REPAIR  10/01/2016   w'mesh  . LAPAROSCOPIC CHOLECYSTECTOMY  01/2007  . MIDDLE EAR SURGERY Right    "repaired hole in my eardrum from tube"  . TONSILLECTOMY AND ADENOIDECTOMY  1991  . TYMPANOSTOMY TUBE PLACEMENT Bilateral   . VENTRAL HERNIA REPAIR N/A 10/01/2016   Procedure: LAPAROSCOPIC VENTRAL HERNIA WITH MESH;  Surgeon: Chevis Pretty III, MD;  Location: [facility];  Service: General;  Laterality: N/A;  . WISDOM TOOTH EXTRACTION     Social History   Tobacco Use  . Smoking status: Never Smoker  . Smokeless tobacco: Never Used  Substance Use Topics  . Alcohol use: Yes    Alcohol/week: 0.0 oz    Comment: 10/01/2016 "might have 1 drink/month, if that"   family history includes Alcohol abuse in her maternal grandfather, mother, and sister; Arthritis in her father; Cancer in her maternal grandmother and paternal grandfather; Diabetes in her father, maternal grandmother, paternal aunt, and paternal grandmother; Heart disease in her paternal grandmother; Hypertension in her father, maternal grandfather, maternal grandmother, mother, paternal grandfather, and paternal grandmother; Thyroid disease in her maternal grandmother.  ROS as above:  Medications: Current Outpatient Medications  Medication Sig Dispense Refill  . clonazePAM (KLONOPIN) 0.5 MG tablet Take 0.5-1 tablets (0.25-0.5 mg total) by mouth 2 (two) times daily as  needed for anxiety (Use sparingly to avoid dependence). #30 for 90(ninety) days 30 tablet 0  . fluticasone (FLONASE) 50 MCG/ACT nasal spray Place 2 sprays into both nostrils daily. 16 g 1  . levothyroxine (SYNTHROID, LEVOTHROID) 137 MCG tablet Take 137 mcg by mouth daily before breakfast.   0   No current facility-administered medications for this visit.    Allergies  Allergen Reactions  . No Known Allergies     Health Maintenance Health Maintenance  Topic Date Due  . PAP SMEAR  12/10/2006  . INFLUENZA VACCINE   07/24/2018 (Originally 06/12/2017)  . TETANUS/TDAP  12/31/2021  . HIV Screening  Completed     Exam:  BP 135/83   Pulse 80   Ht 5\' 4"  (1.626 m)   Wt 281 lb (127.5 kg)   BMI 48.23 kg/m  Gen: Well NAD HEENT: EOMI,  MMM Lungs: Normal work of breathing. CTABL Heart: RRR no MRG Abd: NABS, Soft. Nondistended, mildly tender to palpation right upper quadrant no rebound or guarding.  Minimally tender to palpation right lower pelvis with no rebound or guarding.  No masses palpated. Exts: Brisk capillary refill, warm and well perfused.  MSK: Hip is nontender with normal motion   No results found for this or any previous visit (from the past 72 hour(s)). No results found.    Assessment and Plan: 32 y.o. female with  Trochanteric bursitis: Resolved continue home exercise program recheck as needed.  Right pelvis pain unclear etiology concerning for ovarian cyst related pain.  Plan for pelvic ultrasound with blood flow.  Recheck with PCP in the near future.  Right upper quadrant abdominal pain: Concerning for liver related.  Patient has a history of steatohepatitis.  Will check metabolic panel CBC and lipase as well as an upper quadrant abdominal ultrasound.  Discussed weight loss and reduce carbohydrate diet.  Recheck with PCP in the near future.   Orders Placed This Encounter  Procedures  . US Abdomen Complete    Standing Status:   Future    Number of Occurrences:   1    Standing Expiration Date:   01/12/2019    Order Specific Question:   Reason for Exam (SYMPTOM  OR DIAGNOSIS REQUIRED)    Answer:   eval URQ pain and fatty liver disease    Order Specific Question:   [facility] imaging [facility]?    [facility]  . US Art/Ven Flow Abd Pelv Doppler    Standing Status:   Future    Standing Expiration Date:   01/12/2019    Order Specific Question:   Reason for Exam (SYMPTOM  OR DIAGNOSIS REQUIRED)    Answer:   Eval RLQ abd pain and ovary cyst    Order Specific Question:    Preferred imaging location?    [facility]  . CBC with Differential/Platelet  . COMPLETE METABOLIC PANEL WITH GFR  . Lipase   No orders of the defined types were placed in this encounter.    Discussed warning signs or symptoms. Please see discharge instructions. Patient expresses understanding.

## 2017-11-13 NOTE — Patient Instructions (Signed)
Thank you for coming in today. Get labs today and go to Xray to schedule ultrasounds.  Recheck with Dr Lyn HollingsheadAlexander.  Work on weight loss and decreased carbs.   Recheck with me as needed.   Continue home exercises for back and hip pain.

## 2017-11-22 ENCOUNTER — Encounter: Payer: Self-pay | Admitting: Osteopathic Medicine

## 2017-12-09 ENCOUNTER — Encounter: Payer: Managed Care, Other (non HMO) | Admitting: Osteopathic Medicine

## 2018-01-16 ENCOUNTER — Encounter: Payer: Self-pay | Admitting: Osteopathic Medicine

## 2018-01-16 MED ORDER — ALBUTEROL SULFATE HFA 108 (90 BASE) MCG/ACT IN AERS: 2.0000 | INHALATION_SPRAY | Freq: Four times a day (QID) | RESPIRATORY_TRACT | 11 refills | Status: DC | PRN

## 2018-01-24 ENCOUNTER — Emergency Department
Admission: EM | Admit: 2018-01-24 | Discharge: 2018-01-24 | Disposition: A | Discharge status: Home / Self Care | Payer: Managed Care, Other (non HMO) | Source: Home / Self Care | Attending: Family Medicine | Admitting: Family Medicine

## 2018-01-24 ENCOUNTER — Encounter: Payer: Self-pay | Admitting: Osteopathic Medicine

## 2018-01-24 ENCOUNTER — Emergency Department (INDEPENDENT_AMBULATORY_CARE_PROVIDER_SITE_OTHER): Payer: Managed Care, Other (non HMO)

## 2018-01-24 ENCOUNTER — Other Ambulatory Visit: Payer: Self-pay

## 2018-01-24 DIAGNOSIS — R05 Cough: Secondary | ICD-10-CM

## 2018-01-24 DIAGNOSIS — R509 Fever, unspecified: Secondary | ICD-10-CM | POA: Diagnosis not present

## 2018-01-24 DIAGNOSIS — J9801 Acute bronchospasm: Secondary | ICD-10-CM

## 2018-01-24 DIAGNOSIS — J40 Bronchitis, not specified as acute or chronic: Secondary | ICD-10-CM

## 2018-01-24 LAB — POCT CBC W AUTO DIFF (K'VILLE URGENT CARE)

## 2018-01-24 LAB — POCT INFLUENZA A/B
INFLUENZA B, POC: NEGATIVE
Influenza A, POC: NEGATIVE

## 2018-01-24 LAB — POCT URINE PREGNANCY: Preg Test, Ur: NEGATIVE

## 2018-01-24 MED ORDER — PREDNISONE 20 MG PO TABS
ORAL_TABLET | ORAL | 0 refills | Status: DC
Start: 2018-01-24 — End: 2018-02-19

## 2018-01-24 MED ORDER — IPRATROPIUM-ALBUTEROL 0.5-2.5 (3) MG/3ML IN SOLN
3.0000 mL | Freq: Once | RESPIRATORY_TRACT | Status: AC
  Administered 2018-01-24: 3 mL via RESPIRATORY_TRACT

## 2018-01-24 NOTE — Discharge Instructions (Addendum)
Take plain guaifenesin (1200mg  extended release tabs such as Mucinex) twice daily, with plenty of water, for cough and congestion.   Get adequate rest.   Continue albuterol inhaler as needed. Continue Tessalon at bedtime as needed for cough. Stop all antihistamines for now, and other non-prescription cough/cold preparations.

## 2018-01-24 NOTE — ED Provider Notes (Signed)
[facility]    CSN: [id number] Arrival date & time: 01/24/18  1639     History   Chief Complaint Chief Complaint  Patient presents with  . Cough  . Fever    HPI Audrey Greer is a 32 y.o. female.   Patient reports that she began to feel ill with flu-like symptoms about 12 days ago, and three days later was prescribed Tamiflu.  Three days ago she still had fever, cough, and wheezing, and was prescribed a Z-pak.  She felt better yesterday, but today feels worse with fever developing to 101 today.  Her cough is partly productive, and she has had increased shortness of breath improved with her albuterol inhaler. She has a history of pneumonia last year, and also history of otitis media.   The history is provided by the patient.    Past Medical History:  Diagnosis Date  . Anxiety   . Asthma   . Chronic bronchitis (H[facility])   . Cystitis   . History of hypertension   . Hypertension   . Hypothyroidism   . Migraine    "a few/year" (10/01/2016)  . Pneumonia    "many many times; most recent was 11/2015" (10/01/2016)  . Polycystic ovarian syndrome     Patient Active Problem List   Diagnosis Date Noted  . Panic attack 06/07/2017  . Right thyroid nodule 03/21/2017  . GAD (generalized anxiety disorder) 10/24/2016  . Ventral hernia without obstruction or gangrene 10/01/2016  . Morbid obesity ([facility]) 04/14/2015  . History of irregular menstrual cycles 05/27/2014  . VBAC, delivered, current hospitalization 12/31/2011  . Third degree perineal laceration, delivered, current hospitalization 12/31/2011  . PCOS (polycystic ovarian syndrome) 10/09/2011  . Hypothyroidism 10/09/2011  . Asthma 10/09/2011  . History of hemolysis, elevated liver enzymes, and low platelet (HELLP) syndrome 10/09/2011    Past Surgical History:  Procedure Laterality Date  . CESAREAN SECTION  2007  . GANGLION CYST EXCISION Left ~ 2011  . HERNIA REPAIR    . INSERTION OF MESH N/A 10/01/2016   Procedure: INSERTION OF MESH;  Surgeon: Chevis PrettyPaul Toth III, MD;  [facility];  Service: General;  Laterality: N/A;  . LAPAROSCOPIC ASSISTED VENTRAL HERNIA REPAIR  10/01/2016   w'mesh  . LAPAROSCOPIC CHOLECYSTECTOMY  01/2007  . MIDDLE EAR SURGERY Right    "repaired hole in my eardrum from tube"  . TONSILLECTOMY AND ADENOIDECTOMY  1991  . TYMPANOSTOMY TUBE PLACEMENT Bilateral   . VENTRAL HERNIA REPAIR N/A 10/01/2016   Procedure: LAPAROSCOPIC VENTRAL HERNIA WITH MESH;  Surgeon: Chevis PrettyPaul Toth III, MD;  [facility];  Service: General;  Laterality: N/A;  . WISDOM TOOTH EXTRACTION      OB History    Gravida Para Term Preterm AB Living   3 2 1 1 1 2    SAB TAB Ectopic Multiple Live Births   1 0 0 0 1       Home Medications    Prior to Admission medications   Medication Sig Start Date End Date Taking? Authorizing Provider  albuterol (PROVENTIL HFA;VENTOLIN HFA) 108 (90 Base) MCG/ACT inhaler Inhale 2 puffs into the lungs every 6 (six) hours as needed for wheezing. 01/16/18   Sunnie NielsenAlexander, Natalie, DO  clonazePAM (KLONOPIN) 0.5 MG tablet Take 0.5-1 tablets (0.25-0.5 mg total) by mouth 2 (two) times daily as needed for anxiety (Use sparingly to avoid dependence). #30 for 90(ninety) days 07/24/17   Sunnie NielsenAlexander, Natalie, DO  fluticasone [facility](FLONASE) 50 MCG/ACT nasal spray Place 2 sprays into  both nostrils daily. 07/24/17 07/24/18  Sunnie Nielsen, DO  levothyroxine (SYNTHROID, LEVOTHROID) 137 MCG tablet Take 137 mcg by mouth daily before breakfast.  08/22/16   [provider]  predniSONE (DELTASONE) 20 MG tablet Take one tab by mouth twice daily for 5 days, then one daily.. Take with food. 01/24/18   Lattie Haw, MD    Family History Family History  Problem Relation Age of Onset  . Arthritis Father   . Hypertension Father   . Diabetes Father   . Hypertension Maternal Grandmother   . Diabetes Maternal Grandmother   . Cancer Maternal Grandmother   . Thyroid disease Maternal Grandmother     . Diabetes Paternal Grandmother   . Hypertension Paternal Grandmother   . Heart disease Paternal Grandmother   . Cancer Paternal Grandfather   . Hypertension Paternal Grandfather   . Hypertension Mother   . Alcohol abuse Mother   . Alcohol abuse Sister   . Diabetes Paternal Aunt   . Alcohol abuse Maternal Grandfather   . Hypertension Maternal Grandfather   . Anesthesia problems Neg Hx     Social History Social History   Tobacco Use  . Smoking status: Never Smoker  . Smokeless tobacco: Never Used  Substance Use Topics  . Alcohol use: Yes    Alcohol/week: 0.0 oz    Comment: 10/01/2016 "might have 1 drink/month, if that"  . Drug use: No     Allergies   No known allergies   Review of Systems Review of Systems No sore throat + cough No pleuritic pain + wheezing + nasal congestion ? post-nasal drainage No sinus pain/pressure No itchy/red eyes No earache No hemoptysis + SOB + fever, + chills No nausea No vomiting No abdominal pain No diarrhea No urinary symptoms No skin rash + fatigue No myalgias No headache Used OTC meds without relief   Physical Exam Triage Vital Signs ED Triage Vitals [01/24/18 1743]  Enc Vitals Group     BP (!) 146/95     Pulse Rate 96     Resp      Temp 99.6 F (37.6 C)     Temp Source Oral     SpO2 100 %     Weight 282 lb (127.9 kg)     Height 5\' 4"  (1.626 m)     Head Circumference      Peak Flow      Pain Score 0     Pain Loc      Pain Edu?      Excl. in GC?    No data found.  Updated Vital Signs BP (!) 146/95 (BP Location: Right Arm)   Pulse 96   Temp 99.6 F (37.6 C) (Oral)   Ht 5\' 4"  (1.626 m)   Wt 282 lb (127.9 kg)   SpO2 100%   BMI 48.41 kg/m   Visual Acuity Right Eye Distance:   Left Eye Distance:   Bilateral Distance:    Right Eye Near:   Left Eye Near:    Bilateral Near:     Physical Exam Nursing notes and Vital Signs reviewed. Appearance:  Patient appears stated age, and in no acute  distress Eyes:  Pupils are equal, round, and reactive to light and accomodation.  Extraocular movement is intact.  Conjunctivae are not inflamed  Ears:  Canals normal.  Tympanic membranes normal.  Nose:  Mildly congested turbinates.  No sinus tenderness.  Pharynx:  Normal Neck:  Supple.  Enlarged posterior/lateral nodes are palpated  bilaterally, tender to palpation on the left.   Lungs:  Clear to auscultation.  Breath sounds are equal.  Moving air well. Heart:  Regular rate and rhythm without murmurs, rubs, or gallops.  Abdomen:  Nontender without masses or hepatosplenomegaly.  Bowel sounds are present.  No CVA or flank tenderness.  Extremities:  No edema.  Skin:  No rash present.    UC Treatments / Results  Labs (all labs ordered are listed, but only abnormal results are displayed) Labs Reviewed  POCT INFLUENZA A/B negative  POCT CBC W AUTO DIFF ([facility]):  WBC 10.6; LY 15.1; MO 5.1; GR 79.8; Hgb 12.9; Platelets 279   POCT URINE PREGNANCY negative    EKG  EKG Interpretation None       Radiology Dg Chest 2 View  Result Date: 01/24/2018 CLINICAL DATA:  Fever and cough since Tuesday. EXAM: CHEST - 2 VIEW COMPARISON:  None. FINDINGS: The heart size and mediastinal contours are within normal limits. Both lungs are clear. The visualized skeletal structures are unremarkable. IMPRESSION: No active cardiopulmonary disease. Electronically Signed   By: Tollie Eth M.D.   On: 01/24/2018 18:25    Procedures Procedures (including critical care time)  Medications Ordered in UC Medications  ipratropium-albuterol (DUONEB) 0.5-2.5 (3) MG/3ML nebulizer solution 3 mL (not administered)     Initial Impression / Assessment and Plan / UC Course  I have reviewed the triage vital signs and the nursing notes.  Pertinent labs & imaging results that were available during my care of the patient were reviewed by me and considered in my medical decision making (see chart for details).     Normal chest x-ray reassuring.  White blood count minimally elevated (WBC 10.6).  May have new viral URI. Continue azithromycin. Administered DuoNeb by hand held nebulizer. Begin prednisone burst/taper. Take plain guaifenesin (1200mg  extended release tabs such as Mucinex) twice daily, with plenty of water, for cough and congestion.   Get adequate rest.   Continue albuterol inhaler as needed. Continue Tessalon at bedtime as needed for cough. Stop all antihistamines for now, and other non-prescription cough/cold preparations. Followup with Family Doctor if not improved in about 5 days.    Final Clinical Impressions(s) / UC Diagnoses   Final diagnoses:  Bronchitis  Bronchospasm, acute    ED Discharge Orders        Ordered    predniSONE (DELTASONE) 20 MG tablet     01/24/18 1855          Lattie Haw, MD 01/27/18 1539

## 2018-01-24 NOTE — ED Triage Notes (Signed)
Pt started feeling bad Tuesday of last week.  Saw NP on Wednesday and started Tamiflu.  This past Tuesday, fever was still present and saw NP on Wednesday, and was started on on Z pac due to wheezing in right lung.  Felt better yesterday, but last night and today has felt worse.

## 2018-01-26 ENCOUNTER — Telehealth: Payer: Self-pay | Admitting: Emergency Medicine

## 2018-01-26 NOTE — Telephone Encounter (Signed)
Patient states that she is doing well, no other problems.  Patient will follow up with PCP as needed.

## 2018-02-13 ENCOUNTER — Encounter: Payer: Self-pay | Admitting: Osteopathic Medicine

## 2018-02-19 ENCOUNTER — Ambulatory Visit (INDEPENDENT_AMBULATORY_CARE_PROVIDER_SITE_OTHER): Payer: Managed Care, Other (non HMO) | Admitting: Osteopathic Medicine

## 2018-02-19 ENCOUNTER — Encounter: Payer: Self-pay | Admitting: Osteopathic Medicine

## 2018-02-19 VITALS — BP 131/89 | HR 81 | Temp 98.3°F | Wt 292.0 lb

## 2018-02-19 DIAGNOSIS — R030 Elevated blood-pressure reading, without diagnosis of hypertension: Secondary | ICD-10-CM

## 2018-02-19 DIAGNOSIS — F411 Generalized anxiety disorder: Secondary | ICD-10-CM

## 2018-02-19 DIAGNOSIS — J4530 Mild persistent asthma, uncomplicated: Secondary | ICD-10-CM

## 2018-02-19 DIAGNOSIS — E039 Hypothyroidism, unspecified: Secondary | ICD-10-CM | POA: Diagnosis not present

## 2018-02-19 DIAGNOSIS — J302 Other seasonal allergic rhinitis: Secondary | ICD-10-CM | POA: Diagnosis not present

## 2018-02-19 DIAGNOSIS — E282 Polycystic ovarian syndrome: Secondary | ICD-10-CM | POA: Diagnosis not present

## 2018-02-19 MED ORDER — CLONAZEPAM 0.5 MG PO TABS: 0.2500 mg | ORAL_TABLET | Freq: Two times a day (BID) | ORAL | 0 refills | Status: DC | PRN

## 2018-02-19 NOTE — Progress Notes (Signed)
HPI: Audrey Greer is a 32 y.o. female who  has a past medical history of Anxiety, Asthma, Chronic bronchitis (HCC), Cystitis, History of hypertension, Hypertension, Hypothyroidism, Migraine, Pneumonia, and Polycystic ovarian syndrome.  she presents to [facility] today, 02/19/18,  for chief complaint of:  Anxiety follow-up, refill on Klonopin  Anxiety: Last seen 6 months ago, due for followup to maintain controlled substance Rx, MP reviewed as below, last fill in 08/2017 for #20.   Allergies/Asthma: concerned about wheezing, would like lungs checked. Was given Flovent and antibiotics not too long ago by nurse practitioner  - no records available.  Nurse practitioner apparently works at her husband's place of employment, she can be seen for 3 there is that she tries to go there when she can.  Apparently was suffering from some respiratory issues last month, treatment as asthma exacerbation, Flovent was started.  She is taking this intermittently  Hypothyroid, PCOS: Requests referral for new endocrinologist, Dr Shawnee Knapp was recommended to her by a friend and she would like to see him, has seen his partner in the past several years ago.   Elevated BP: systolic 150's on intake, stressed about losing something from her purse. Better on recheck.     PMP reviewed:   Rx Data PRESCRIPTIONS Total Prescriptions: 7 Total Private Pay: 0 Fill Date ID Written Drug Qty Days Prescriber Rx # Pharmacy Refill Daily Dose * Pymt Type PMP 08/16/2017 2 07/24/2017 Clonazepam 0.5 Mg Tablet 20 20 Na Ale [id number] [facility] ([id number]) 1 1.00 LME Comm Ins [place] 07/24/2017 2 07/24/2017 Clonazepam 0.5 Mg Tablet 10 30 Na Ale [id number] [facility] ([id number]) 0 0.33 LME Comm Ins [place] 06/03/2017 1 06/03/2017 Clonazepam 0.5 Mg Tablet 20 10 [facility] Hil [id number] [facility] ([id number]) 0 2.00 LME Comm Ins [place] 04/09/2017 1 04/09/2017 Clonazepam 0.5 Mg Tablet 20 10 St Hil [id number] [facility] ([id number]) 0 2.00 LME Comm  Ins [place] 11/27/2016 1 11/27/2016 Clonazepam 0.5 Mg Tablet 20 10 St Hil [id number] [facility] ([id number]) 0 2.00 LME Comm Ins [place] 10/24/2016 1 10/24/2016 Clonazepam 0.5 Mg Tablet 20 10 St Hil [id number] [facility] ([id number]) 0 2.00 LME Comm Ins [place] 10/02/2016 1 10/02/2016 Hydrocodone-Acetamin 5-325 Mg 30 3 Pa Tot [id number] [facility] ([id number]) 0 50.00 MME Comm Ins [place]    Past medical history, surgical history, social history and family history reviewed.   Current medication list and allergy/intolerance information reviewed.    Current Outpatient Medications on File Prior to Visit  Medication Sig Dispense Refill  . albuterol (PROVENTIL HFA;VENTOLIN HFA) 108 (90 Base) MCG/ACT inhaler Inhale 2 puffs into the lungs every 6 (six) hours as needed for wheezing. 2 Inhaler 11  . clonazePAM (KLONOPIN) 0.5 MG tablet Take 0.5-1 tablets (0.25-0.5 mg total) by mouth 2 (two) times daily as needed for anxiety (Use sparingly to avoid dependence). #30 for 90(ninety) days 30 tablet 0  . FLOVENT HFA 44 MCG/ACT inhaler USE 2 PUFFS TWICE DAILY  2  . fluticasone (FLONASE) 50 MCG/ACT nasal spray Place 2 sprays into both nostrils daily. 16 g 1  . levothyroxine (SYNTHROID, LEVOTHROID) 137 MCG tablet Take 137 mcg by mouth daily before breakfast.   0  . predniSONE (DELTASONE) 20 MG tablet Take one tab by mouth twice daily for 5 days, then one daily.. Take with food. (Patient not taking: Reported on 02/19/2018) 14 tablet 0   No current facility-administered medications on file prior to visit.    Allergies  Allergen Reactions  . No Known Allergies  Review of Systems:  Constitutional: No recent illness  HEENT: No  headache, no vision change  Cardiac: No  chest pain, No  pressure, No palpitations  Respiratory:  +shortness of breath. +Cough  Gastrointestinal: No  abdominal pain, no change on bowel habits  Musculoskeletal: No new myalgia/arthralgia  Skin: No  Rash  Neurologic: No  weakness, No  Dizziness  Psychiatric: No  concerns with  depression, +concerns with anxiety occasional   Exam:  BP 131/89 (BP Location: Right Arm, Patient Position: Sitting, Cuff Size: Large)   Pulse 81   Temp 98.3 F (36.8 C) (Oral)   Wt 292 lb (132.5 kg)   LMP 01/10/2018   BMI 50.12 kg/m   Constitutional: VS see above. General Appearance: alert, well-developed, well-nourished, NAD  Eyes: Normal lids and conjunctive, non-icteric sclera  Ears, Nose, Mouth, Throat: MMM, Normal external inspection ears/nares/mouth/lips/gums.  Neck: No masses, trachea midline.   Respiratory: Normal respiratory effort. no wheeze, no rhonchi, no rales  Cardiovascular: S1/S2 normal, no murmur, no rub/gallop auscultated. RRR.   Musculoskeletal: Gait normal. Symmetric and independent movement of all extremities  Neurological: Normal balance/coordination. No tremor.  Skin: warm, dry, intact.   Psychiatric: Normal judgment/insight. Normal mood and affect. Oriented x3.     ASSESSMENT/PLAN:   GAD (generalized anxiety disorder) - Okay to continue sparing use of clonazepam - Plan: clonazePAM (KLONOPIN) 0.5 MG tablet  Hypothyroidism, unspecified type - Pearl placed to endocrinologist as requested - Plan: Ambulatory referral to Endocrinology  PCOS (polycystic ovarian syndrome) - For a placed endocrinologist as requested - Plan: Ambulatory referral to Endocrinology  Seasonal allergies - Fairly well controlled but may be complicated by asthma.  Continue current medications  Mild persistent asthma without complication - Advise continue seasonal use of steroid inhaler with as needed rescue inhaler.  Come see me if worsening or frequent exacerbations - Plan: FLOVENT HFA 44 MCG/ACT inhaler  Elevated blood pressure reading - Patient states she will get blood pressure checks with nurse practitioner at husband's place of employment, goal BP discussed, see me of higher   Meds ordered this encounter  Medications  . clonazePAM (KLONOPIN) 0.5 MG tablet    Sig: Take  0.5-1 tablets (0.25-0.5 mg total) by mouth 2 (two) times daily as needed for anxiety (Use sparingly to avoid dependence).    Dispense:  30 tablet    Refill:  0    Patient Instructions  Goal BP 130/80 or so  If higher than this consistently, come see me!     Follow-up plan: Return in about 6 months (around 08/21/2018) for annual checkup .  Visit summary with medication list and pertinent instructions was printed for patient to review, alert us if any changes needed. All questions at time of visit were answered - patient instructed to contact office with any additional concerns. ER/RTC precautions were reviewed with the patient and understanding verbalized.     Please note: voice recognition software was used to produce this document, and typos may escape review. Please contact Dr. Lyn HollingsheadAlexander for any needed clarifications.

## 2018-02-19 NOTE — Patient Instructions (Signed)
Goal BP 130/80 or so  If higher than this consistently, come see me!

## 2018-04-06 IMAGING — US US ABDOMEN COMPLETE
1 series · 14 of 25 positions shown · non-contrast
Comparison: CT abdomen pelvis dated November 24, 2014.

CLINICAL DATA: Right upper quadrant pain for the past 1 and 2
months.

EXAM:
ABDOMEN ULTRASOUND COMPLETE

[Series 1: us abdomen complete · 0.25mm/px · 14 of 67 slices shown]
[im 1/67]
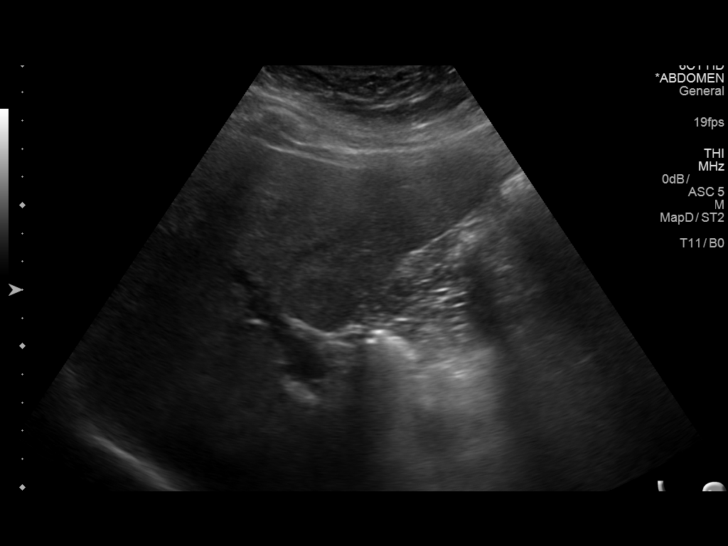
[im 6/67]
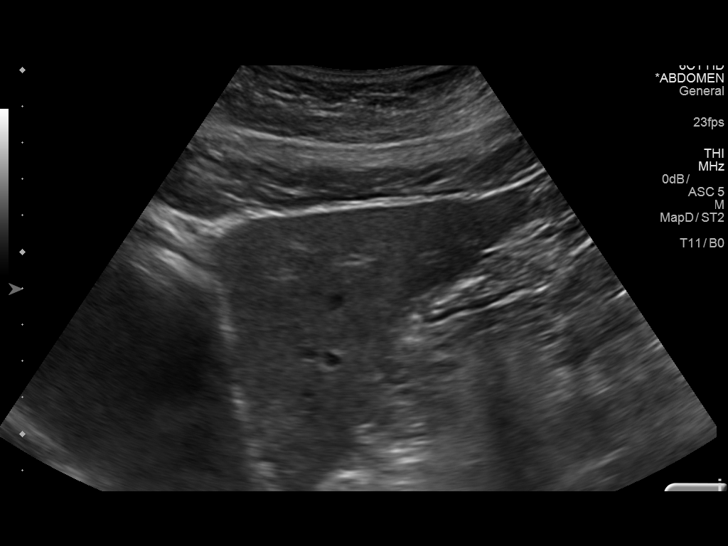
[im 12/67]
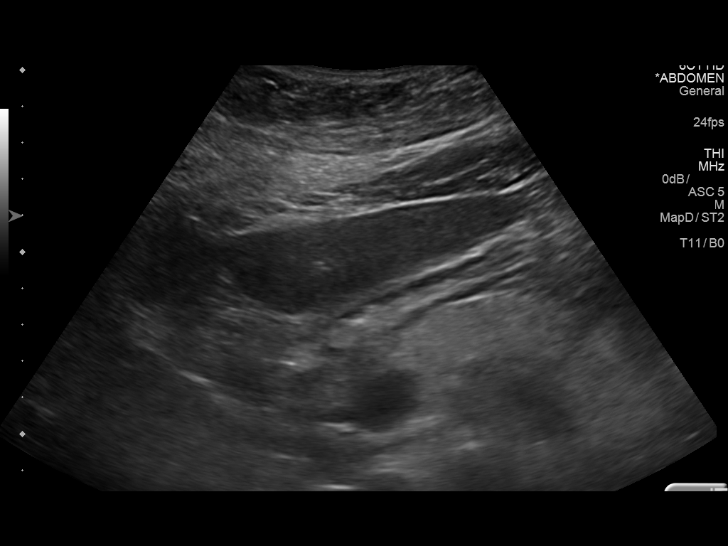
[im 17/67]
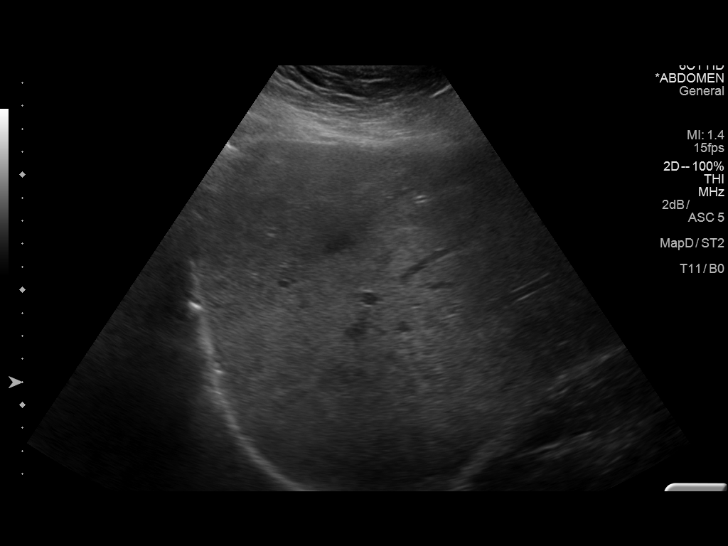
[im 23/67]
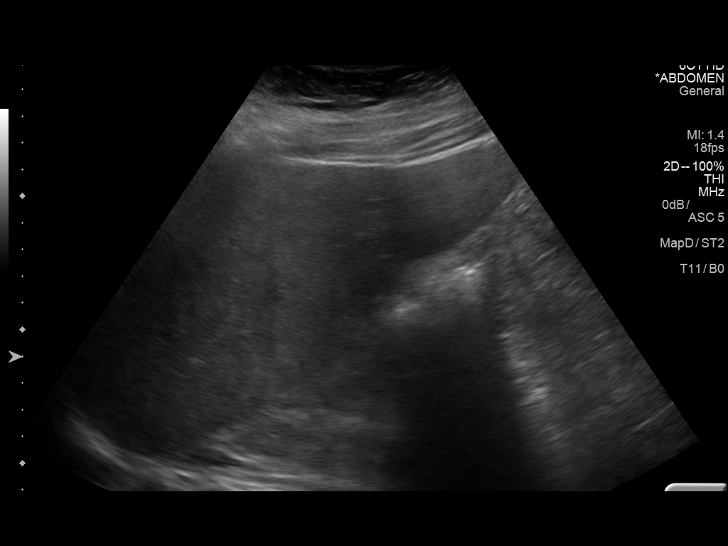
[im 25/67]
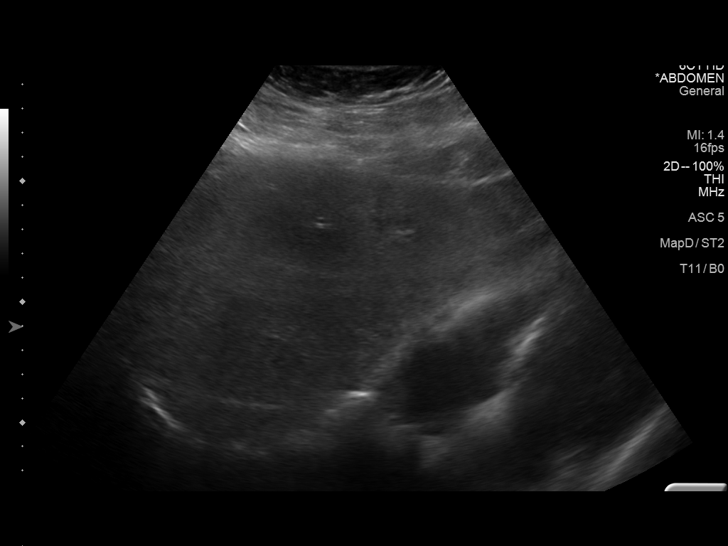
[im 31/67]
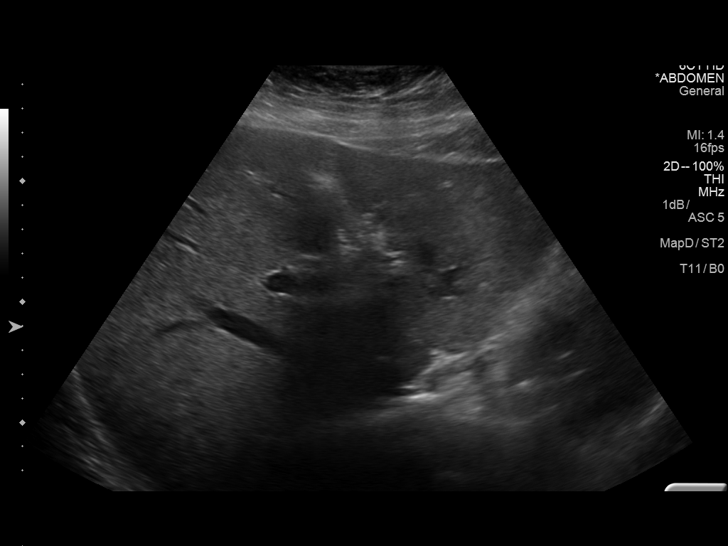
[im 36/67]
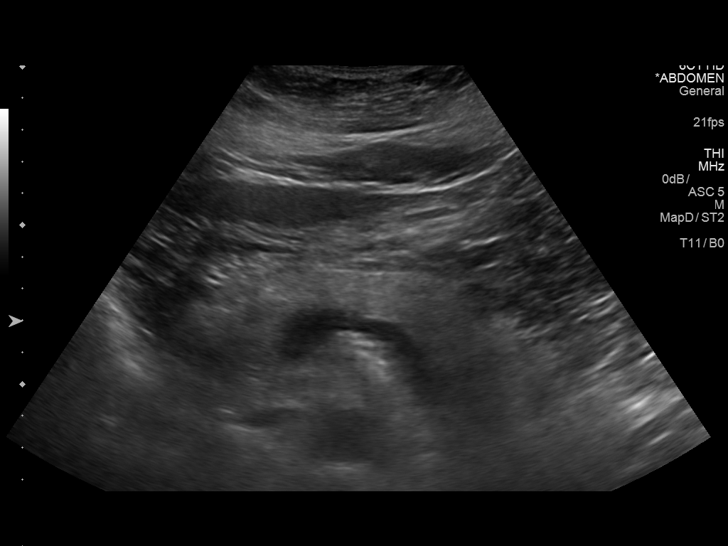
[im 42/67]
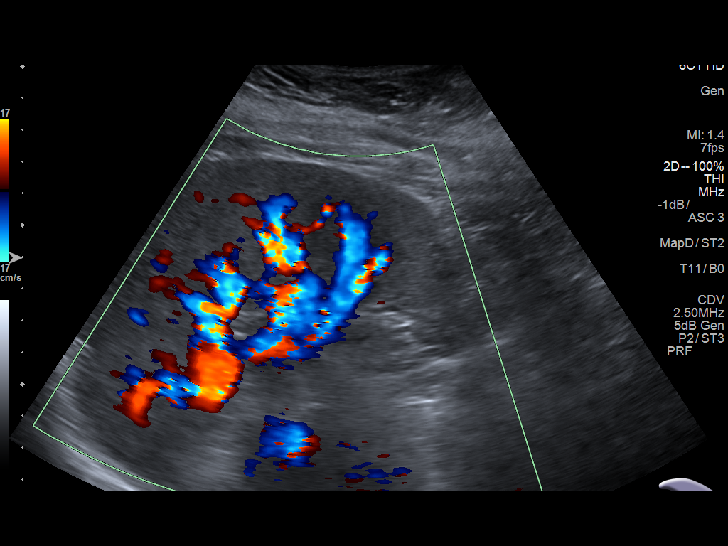
[im 45/67]
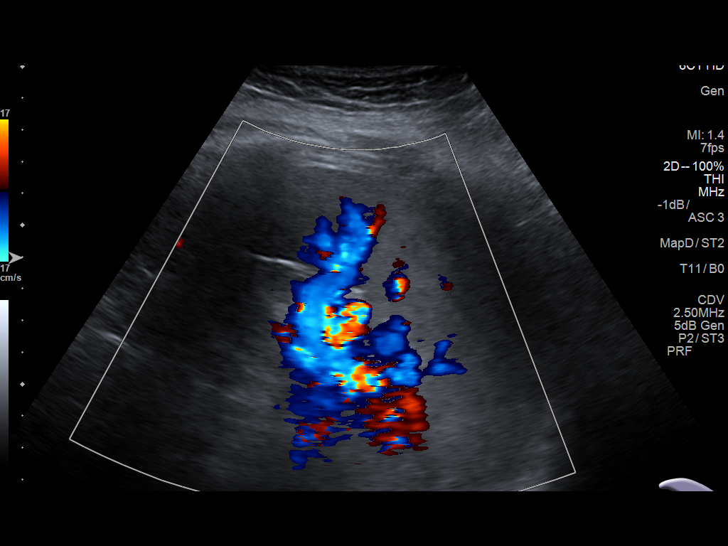
[im 50/67]
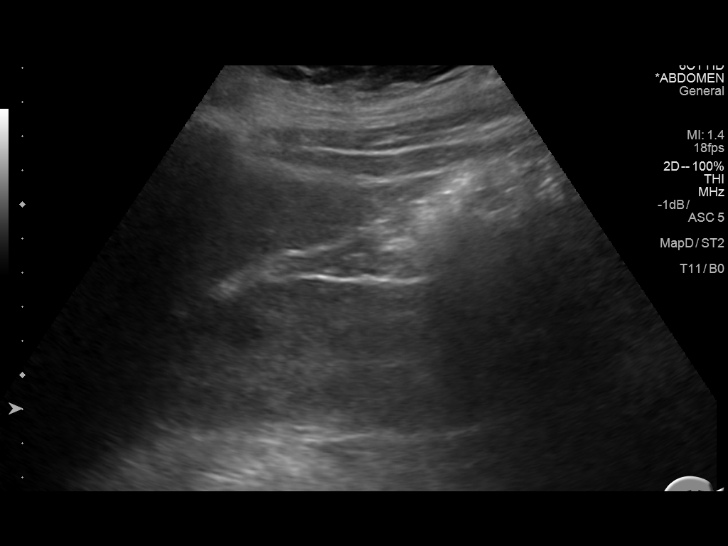
[im 56/67]
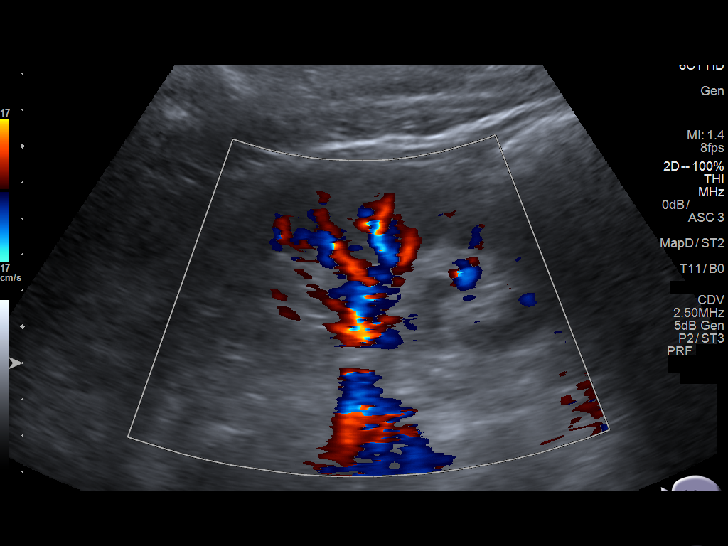
[im 61/67]
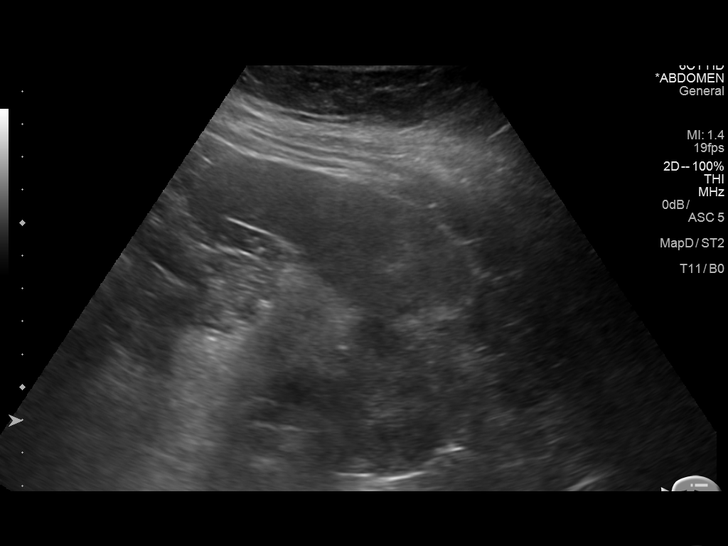
[im 67/67]
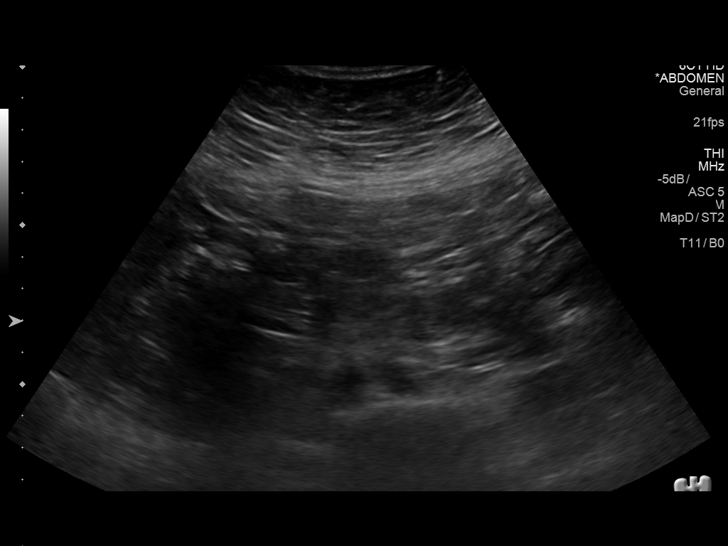

[14 of 25 positions shown; findings below may reference images not displayed]

FINDINGS: Gallbladder: Surgically absent.

Common bile duct: Diameter: 3 mm, normal.

Liver: No focal lesion identified. Diffusely increased parenchymal
echogenicity. Portal vein is patent on color Doppler imaging with
normal direction of blood flow towards the liver.

IVC: No abnormality visualized.

Pancreas: Visualized portion unremarkable.

Spleen: Size and appearance within normal limits.

Right Kidney: Length: 12.0 cm. Echogenicity within normal limits. No
mass or hydronephrosis visualized.

Left Kidney: Length: 11.8 cm. Echogenicity within normal limits. No
mass or hydronephrosis visualized.

Abdominal aorta: No aneurysm visualized.

Other findings: None.
IMPRESSION: 1. No acute abnormality.
2. Hepatic steatosis.

## 2018-08-12 ENCOUNTER — Other Ambulatory Visit: Payer: Self-pay | Admitting: Osteopathic Medicine

## 2018-08-12 DIAGNOSIS — F411 Generalized anxiety disorder: Secondary | ICD-10-CM

## 2018-08-13 MED ORDER — CLONAZEPAM 0.5 MG PO TABS
0.2500 mg | ORAL_TABLET | Freq: Two times a day (BID) | ORAL | 0 refills | Status: DC | PRN
Start: 2018-08-13 — End: 2019-01-26

## 2018-11-04 IMAGING — DX DG LUMBAR SPINE COMPLETE 4+V
5 series · 5 of 5 positions shown · non-contrast
Comparison: CT scan of November 24, 2014.

CLINICAL DATA: Right-sided lower back pain for 2 months.

EXAM:
LUMBAR SPINE - COMPLETE 4+ VIEW

[l-spine ap]
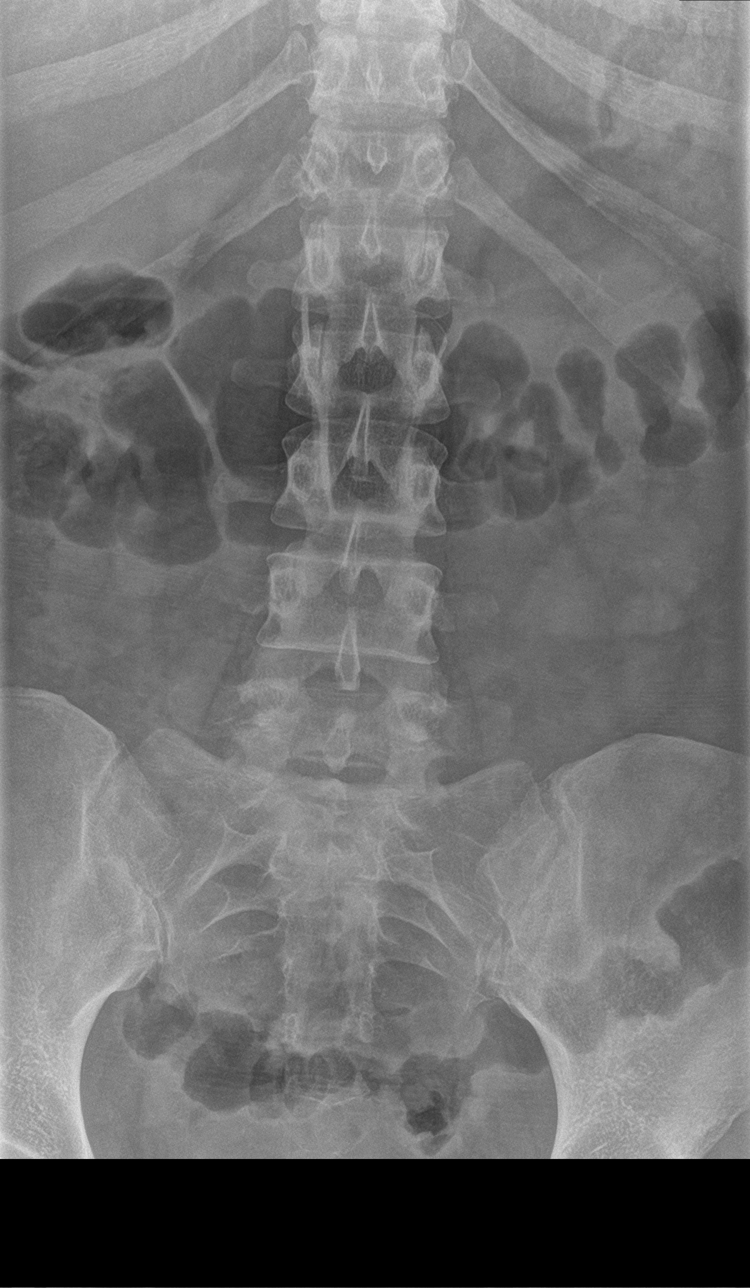

[l-spine obl (1 of 2)]
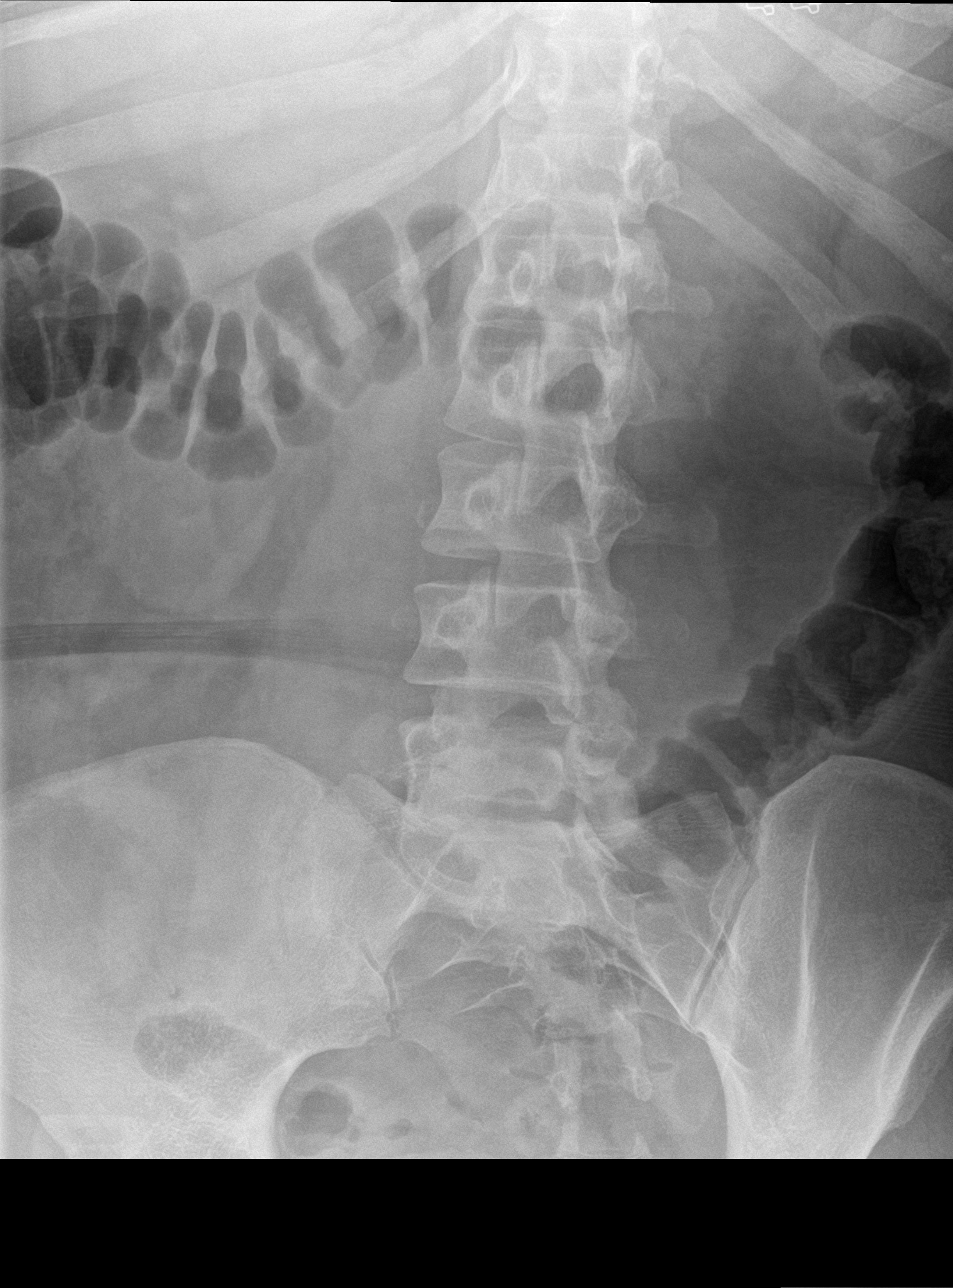

[l-spine obl (2 of 2)]
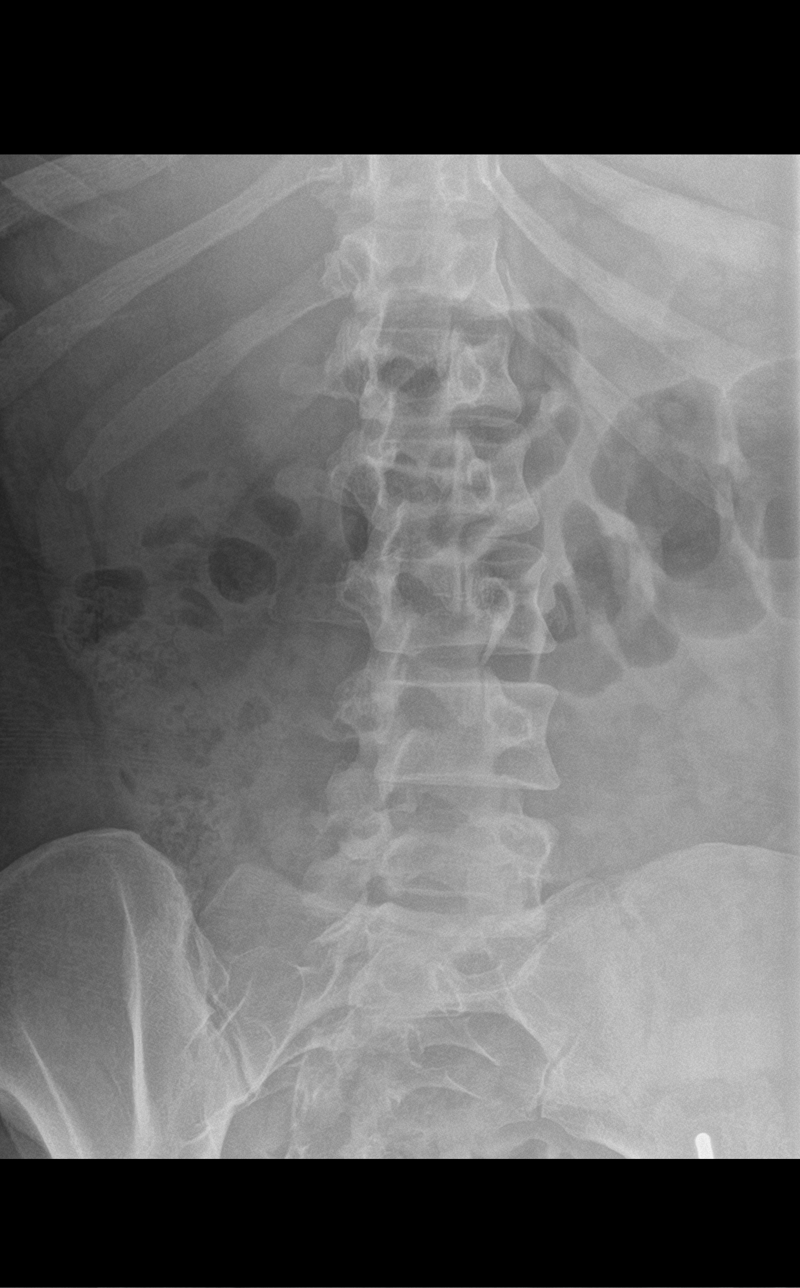

[l-spine lat]
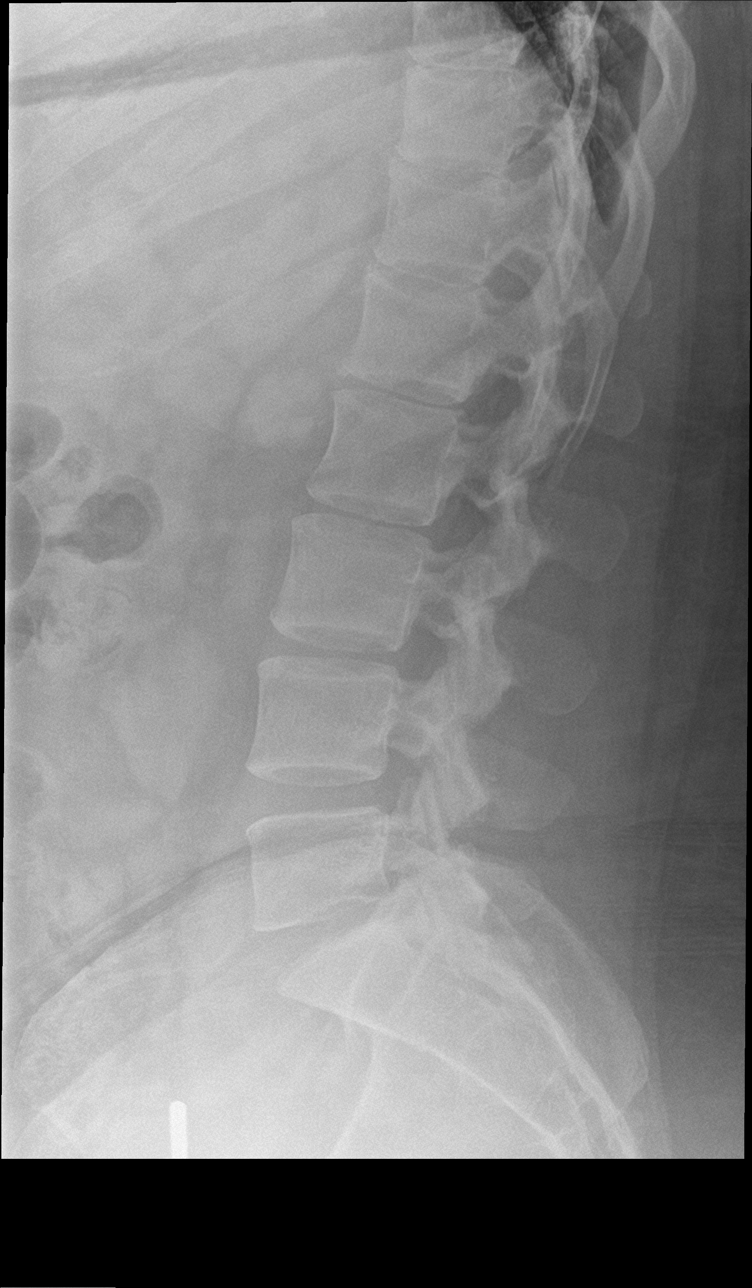

[l-spine spot]
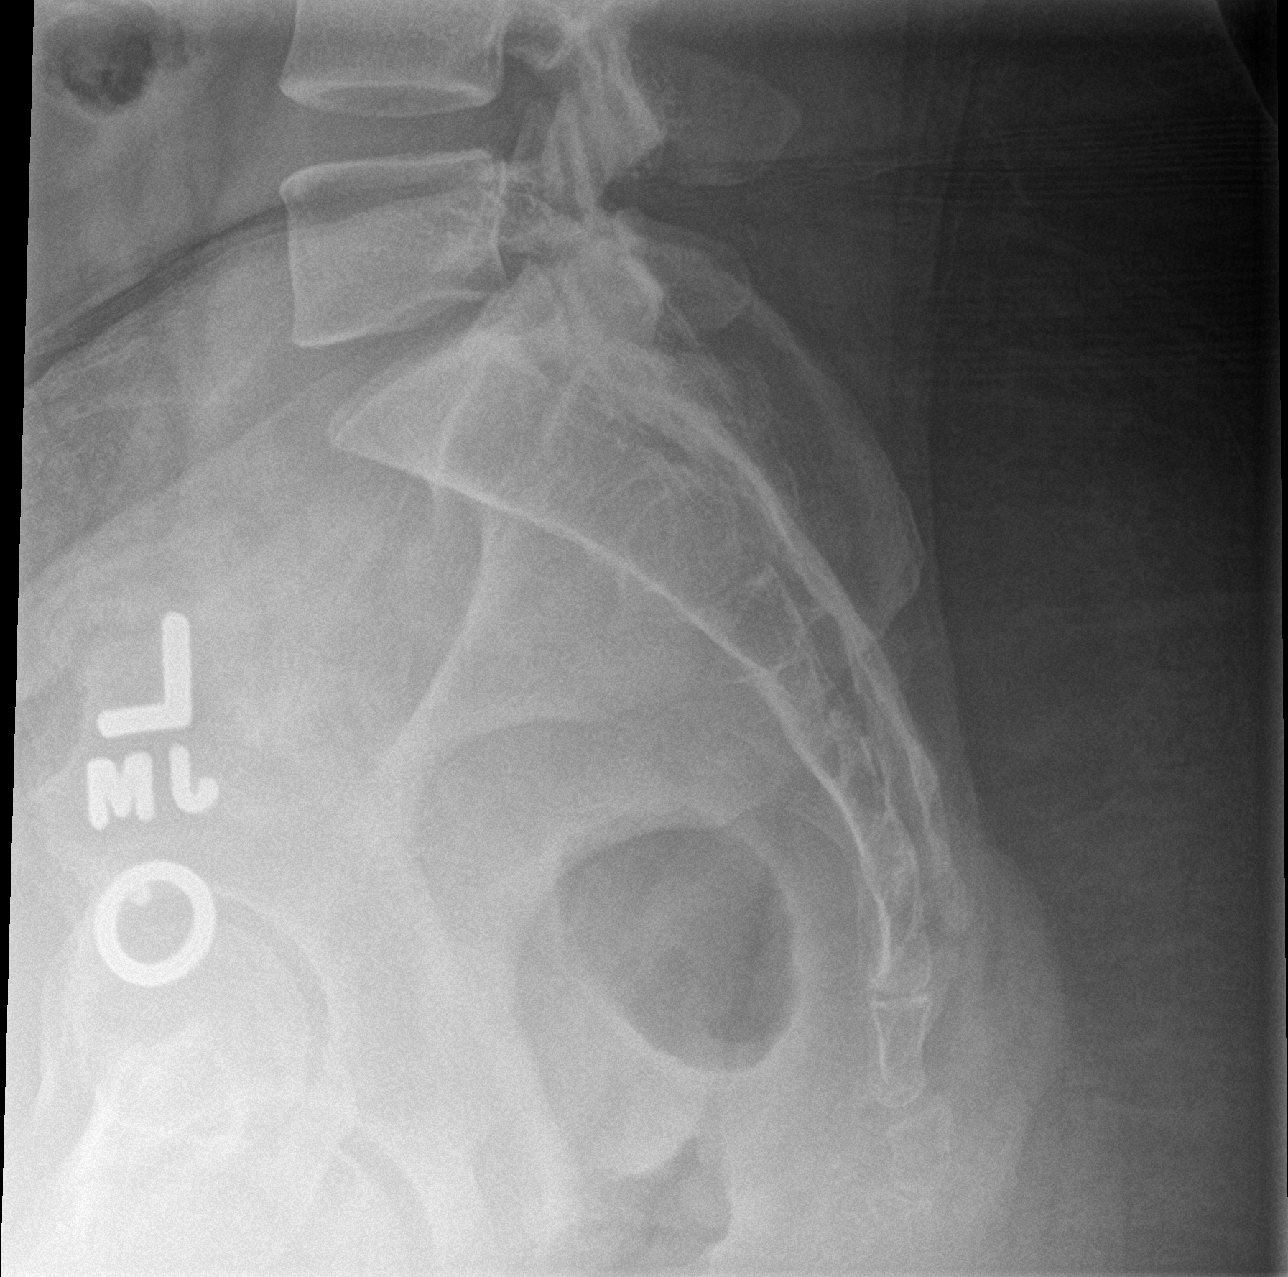

[5 of 5 positions shown; findings below may reference images not displayed]

FINDINGS: There is no evidence of lumbar spine fracture. Alignment is normal.
Intervertebral disc spaces are maintained.
IMPRESSION: Normal lumbar spine.

## 2019-01-26 ENCOUNTER — Ambulatory Visit: Payer: Managed Care, Other (non HMO) | Admitting: Osteopathic Medicine

## 2019-01-26 ENCOUNTER — Other Ambulatory Visit: Payer: Self-pay

## 2019-01-26 ENCOUNTER — Encounter: Payer: Self-pay | Admitting: Osteopathic Medicine

## 2019-01-26 VITALS — BP 139/85 | HR 73 | Temp 98.3°F | Wt 296.7 lb

## 2019-01-26 DIAGNOSIS — F411 Generalized anxiety disorder: Secondary | ICD-10-CM | POA: Diagnosis not present

## 2019-01-26 DIAGNOSIS — F339 Major depressive disorder, recurrent, unspecified: Secondary | ICD-10-CM

## 2019-01-26 MED ORDER — CLONAZEPAM 0.5 MG PO TABS: 0.2500 mg | ORAL_TABLET | Freq: Two times a day (BID) | ORAL | 2 refills | Status: DC | PRN

## 2019-01-26 NOTE — Patient Instructions (Signed)
New Rx to consider: Trintellix Viibryd

## 2019-01-26 NOTE — Progress Notes (Signed)
HPI: Audrey Greer is a 33 y.o. female who  has a past medical history of Anxiety, Asthma, Chronic bronchitis (HCC), Cystitis, History of hypertension, Hypertension, Hypothyroidism, Migraine, Pneumonia, and Polycystic ovarian syndrome.  she presents to [facility] today, 01/26/19,  for chief complaint of:  Anxiety  Last saw just under a year ago for GAD. Had been taking Clonazepam intermittently. Last filled few months ago by Dr Denyse Amass. Overall doing well but more panic-prone w/ coronavirus scare at the moment.   Previous Rx:  Prozac 10 mg  Other SSRI Trazodone   Depression screen [facility] 2/9 01/26/2019 02/19/2018 07/24/2017  Decreased Interest 1 1 2   Down, Depressed, Hopeless 1 1 2   PHQ - 2 Score 2 2 4   Altered sleeping 2 3 3   Tired, decreased energy 3 3 3   Change in appetite 1 3 2   Feeling bad or failure about yourself  1 1 2   Trouble concentrating 2 3 3   Moving slowly or fidgety/restless 1 0 1  Suicidal thoughts 0 0 0  PHQ-9 Score 12 15 18   Difficult doing work/chores Somewhat difficult Somewhat difficult -    GAD 7 : Generalized Anxiety Score 01/26/2019 02/19/2018  Nervous, Anxious, on Edge 2 3  Control/stop worrying 2 2  Worry too much - different things 2 3  Trouble relaxing 2 3  Restless 2 3  Easily annoyed or irritable 2 3  Afraid - awful might happen 2 3  Total GAD 7 Score 14 20  Anxiety Difficulty Somewhat difficult Somewhat difficult     Last visit BP was also elevated and better on recheck    At today's visit 01/26/19 ... PMH, PSH, FH reviewed and updated as needed.  Current medication list and allergy/intolerance hx reviewed and updated as needed. (See remainder of HPI, ROS, Phys Exam below)          ASSESSMENT/PLAN: The primary encounter diagnosis was Depression, recurrent (HCC). A diagnosis of GAD (generalized anxiety disorder) was also pertinent to this visit.  Patient would like to defer daily maintenance  medications at this point, okay to continue sparing use of clonazepam.  Would consider 1 of the newer medications like Trintellix or Viibryd, she has tried and failed multiple other SSRIs, TCA.    She has had labs done through a nurse practitioner at her husband's place of employment, no lab records available, patient is encouraged to provide [place] with these records at her earliest convenience     Meds ordered this encounter  Medications  . clonazePAM (KLONOPIN) 0.5 MG tablet    Sig: Take 0.5-1 tablets (0.25-0.5 mg total) by mouth 2 (two) times daily as needed for anxiety (Use sparingly to avoid dependence).    Dispense:  30 tablet    Refill:  2    Patient Instructions  New Rx to consider: Trintellix Viibryd        Follow-up plan: Return in about 6 months (around 07/29/2019) for annual physical, sooner if needed.                                                 ################################################# ################################################# ################################################# #################################################    Current Meds  Medication Sig  . albuterol (PROVENTIL HFA;VENTOLIN HFA) 108 (90 Base) MCG/ACT inhaler Inhale 2 puffs into the lungs every 6 (six) hours as needed for wheezing.  . Ascorbic  Acid (VITAMIN C PO) Take by mouth.  . Cholecalciferol (D3 HIGH POTENCY) 125 MCG (5000 UT) capsule Take by mouth.  . clonazePAM (KLONOPIN) 0.5 MG tablet Take 0.5-1 tablets (0.25-0.5 mg total) by mouth 2 (two) times daily as needed for anxiety (Use sparingly to avoid dependence).  . Cyanocobalamin (RAPID B-12 ENERGY) 200 MCG/SPRAY LIQD   . FLOVENT HFA 44 MCG/ACT inhaler USE 2 PUFFS TWICE DAILY  . levothyroxine (SYNTHROID, LEVOTHROID) 137 MCG tablet Take 137 mcg by mouth daily before breakfast.   . MAGNESIUM PO Take by mouth.  . MULTIPLE VITAMIN PO Take by mouth.  . [DISCONTINUED] clonazePAM  (KLONOPIN) 0.5 MG tablet Take 0.5-1 tablets (0.25-0.5 mg total) by mouth 2 (two) times daily as needed for anxiety (Use sparingly to avoid dependence).    Allergies  Allergen Reactions  . Trazodone And Nefazodone Other (See Comments)    Suicidal thoughts  . Trazodone Hcl Other (See Comments)    Suicidal thoughts  . No Known Allergies        Review of Systems:  Constitutional: No recent illness  HEENT: No  headache, no vision change  Cardiac: No  chest pain, No  pressure, No palpitations  Respiratory:  No  shortness of breath. No  Cough  Musculoskeletal: No new myalgia/arthralgia  Skin: No  Rash  Hem/Onc: No  easy bruising/bleeding, No  abnormal lumps/bumps  Neurologic: No  weakness, No  Dizziness  Psychiatric: +concerns with depression, +++concerns with anxiety  Exam:  BP 139/85 (BP Location: Left Arm, Patient Position: Sitting, Cuff Size: Large)   Pulse 73   Temp 98.3 F (36.8 C) (Oral)   Wt 296 lb 11.2 oz (134.6 kg)   BMI 50.93 kg/m   Constitutional: VS see above. General Appearance: alert, well-developed, well-nourished, NAD  Eyes: Normal lids and conjunctive, non-icteric sclera  Ears, Nose, Mouth, Throat: MMM, Normal external inspection ears/nares/mouth/lips/gums.  Neck: No masses, trachea midline.   Respiratory: Normal respiratory effort. no wheeze, no rhonchi, no rales  Cardiovascular: S1/S2 normal, no murmur, no rub/gallop auscultated. RRR.   Musculoskeletal: Gait normal. Symmetric and independent movement of all extremities  Neurological: Normal balance/coordination. No tremor.  Skin: warm, dry, intact.   Psychiatric: Normal judgment/insight. Normal mood and affect. Oriented x3.       Visit summary with medication list and pertinent instructions was printed for patient to review, patient was advised to alert [place] if any updates are needed. All questions at time of visit were answered - patient instructed to contact office with any additional  concerns. ER/RTC precautions were reviewed with the patient and understanding verbalized.     Please note: voice recognition software was used to produce this document, and typos may escape review. Please contact Dr. Lyn Hollingshead for any needed clarifications.    Follow up plan: Return in about 6 months (around 07/29/2019) for annual physical, sooner if needed.

## 2019-12-29 ENCOUNTER — Encounter: Payer: Self-pay | Admitting: Osteopathic Medicine

## 2019-12-29 ENCOUNTER — Telehealth (INDEPENDENT_AMBULATORY_CARE_PROVIDER_SITE_OTHER): Payer: Managed Care, Other (non HMO) | Admitting: Osteopathic Medicine

## 2019-12-29 DIAGNOSIS — F411 Generalized anxiety disorder: Secondary | ICD-10-CM

## 2019-12-29 MED ORDER — CLONAZEPAM 0.5 MG PO TABS: 0.2500 mg | ORAL_TABLET | Freq: Two times a day (BID) | ORAL | 2 refills | Status: AC | PRN

## 2019-12-29 MED ORDER — ALBUTEROL SULFATE HFA 108 (90 BASE) MCG/ACT IN AERS: 2.0000 | INHALATION_SPRAY | Freq: Four times a day (QID) | RESPIRATORY_TRACT | 99 refills | Status: AC | PRN

## 2019-12-29 NOTE — Progress Notes (Signed)
Virtual Visit via Video (App used: Doximity) Note  I connected with      [name] on 12/29/19 at 9:33 AM  by a telemedicine application and verified that I am speaking with the correct person using two identifiers.  Patient is at home I am in office   I discussed the limitations of evaluation and management by telemedicine and the availability of in person appointments. The patient expressed understanding and agreed to proceed.  History of Present Illness: Audrey Greer is a 34 y.o. female who would like to discuss mental health   Doing overall really well, no major concerns Mental health is ok Fewer panic attacks Daily lower-grade anxiety Would like to hold off on adding maintenance meds Would like refill on the Klonopin to use prn.   Previous Rx:  Prozac 10 mg  Other SSRI Trazodone  Clonazepam prn   PDMP reviewed during this encounter. Last Klonopin filled #30 07/09/2019   Depression screen [facility] 2/9 12/29/2019 01/26/2019 02/19/2018  Decreased Interest 1 1 1   Down, Depressed, Hopeless 0 1 1  PHQ - 2 Score 1 2 2   Altered sleeping 3 2 3   Tired, decreased energy 2 3 3   Change in appetite 2 1 3   Feeling bad or failure about yourself  1 1 1   Trouble concentrating 3 2 3   Moving slowly or fidgety/restless 0 1 0  Suicidal thoughts 0 0 0  PHQ-9 Score 12 12 15   Difficult doing work/chores Somewhat difficult Somewhat difficult Somewhat difficult   GAD 7 : Generalized Anxiety Score 12/29/2019 01/26/2019 02/19/2018  Nervous, Anxious, on Edge 1 2 3   Control/stop worrying 2 2 2   Worry too much - different things 2 2 3   Trouble relaxing 3 2 3   Restless 3 2 3   Easily annoyed or irritable 2 2 3   Afraid - awful might happen 1 2 3   Total GAD 7 Score 14 14 20   Anxiety Difficulty Somewhat difficult Somewhat difficult Somewhat difficult         Observations/Objective: Wt 291 lb (132 kg)   BMI 49.95 kg/m  BP Readings from Last 3 Encounters:  01/26/19 139/85   02/19/18 131/89  01/24/18 (!) 146/95   Exam: Normal Speech.  NAD  Lab and Radiology Results No results found for this or any previous visit (from the past 72 hour(s)). No results found.     Assessment and Plan: 34 y.o. female with The encounter diagnosis was GAD (generalized anxiety disorder).   PDMP reviewed during this encounter. No orders of the defined types were placed in this encounter.  Meds ordered this encounter  Medications  . clonazePAM (KLONOPIN) 0.5 MG tablet    Sig: Take 0.5-1 tablets (0.25-0.5 mg total) by mouth 2 (two) times daily as needed for anxiety (Use sparingly to avoid dependence).    Dispense:  30 tablet    Refill:  2  . albuterol (VENTOLIN HFA) 108 (90 Base) MCG/ACT inhaler    Sig: Inhale 2 puffs into the lungs every 6 (six) hours as needed for wheezing.    Dispense:  32 g    Refill:  prn    Follow Up Instructions: Return in about 6 months (around 06/27/2020) for ANNUAL (call week prior to visit for lab orders).    I discussed the assessment and treatment plan with the patient. The patient was provided an opportunity to ask questions and all were answered. The patient agreed with the plan and demonstrated an understanding of the instructions.  The patient was advised to call back or seek an in-person evaluation if any new concerns, if symptoms worsen or if the condition fails to improve as anticipated.  15 minutes of non-face-to-face time was provided during this encounter.      . . . . . . . . . . . . . Marland Kitchen                   Historical information moved to improve visibility of documentation.  Past Medical History:  Diagnosis Date  . Anxiety   . Asthma   . Chronic bronchitis (HCC)   . Cystitis   . History of hypertension   . Hypertension   . Hypothyroidism   . Migraine    "a few/year" (10/01/2016)  . Pneumonia    "many many times; most recent was 11/2015" (10/01/2016)  . Polycystic ovarian syndrome     Past Surgical History:  Procedure Laterality Date  . CESAREAN SECTION  2007  . GANGLION CYST EXCISION Left ~ 2011  . HERNIA REPAIR    . INSERTION OF MESH N/A 10/01/2016   Procedure: INSERTION OF MESH;  Surgeon: Chevis Pretty III, MD;  Location: [facility] OR;  Service: General;  Laterality: N/A;  . LAPAROSCOPIC ASSISTED VENTRAL HERNIA REPAIR  10/01/2016   w'mesh  . LAPAROSCOPIC CHOLECYSTECTOMY  01/2007  . MIDDLE EAR SURGERY Right    "repaired hole in my eardrum from tube"  . TONSILLECTOMY AND ADENOIDECTOMY  1991  . TYMPANOSTOMY TUBE PLACEMENT Bilateral   . VENTRAL HERNIA REPAIR N/A 10/01/2016   Procedure: LAPAROSCOPIC VENTRAL HERNIA WITH MESH;  Surgeon: Chevis Pretty III, MD;  Location: [facility] OR;  Service: General;  Laterality: N/A;  . WISDOM TOOTH EXTRACTION     Social History   Tobacco Use  . Smoking status: Never Smoker  . Smokeless tobacco: Never Used  Substance Use Topics  . Alcohol use: Yes    Alcohol/week: 0.0 standard drinks    Comment: 10/01/2016 "might have 1 drink/month, if that"   family history includes Alcohol abuse in her maternal grandfather, mother, and sister; Arthritis in her father; Cancer in her maternal grandmother and paternal grandfather; Diabetes in her father, maternal grandmother, paternal aunt, and paternal grandmother; Heart disease in her paternal grandmother; Hypertension in her father, maternal grandfather, maternal grandmother, mother, paternal grandfather, and paternal grandmother; Thyroid disease in her maternal grandmother.  Medications: Current Outpatient Medications  Medication Sig Dispense Refill  . albuterol (VENTOLIN HFA) 108 (90 Base) MCG/ACT inhaler Inhale 2 puffs into the lungs every 6 (six) hours as needed for wheezing. 32 g prn  . Ascorbic Acid (VITAMIN C PO) Take by mouth.    . Cholecalciferol (D3 HIGH POTENCY) 125 MCG (5000 UT) capsule Take by mouth.    . clonazePAM (KLONOPIN) 0.5 MG tablet Take 0.5-1 tablets (0.25-0.5 mg total) by mouth 2 (two)  times daily as needed for anxiety (Use sparingly to avoid dependence). 30 tablet 2  . Cyanocobalamin (RAPID B-12 ENERGY) 200 MCG/SPRAY LIQD     . FLOVENT HFA 44 MCG/ACT inhaler USE 2 PUFFS TWICE DAILY  2  . levothyroxine (SYNTHROID, LEVOTHROID) 137 MCG tablet Take 137 mcg by mouth daily before breakfast.   0  . MAGNESIUM PO Take by mouth.    . MULTIPLE VITAMIN PO Take by mouth.    . fluticasone (FLONASE) 50 MCG/ACT nasal spray Place 2 sprays into both nostrils daily. 16 g 1   No current facility-administered medications for this visit.   Allergies  Allergen  Reactions  . Trazodone And Nefazodone Other (See Comments)    Suicidal thoughts  . Trazodone Hcl Other (See Comments)    Suicidal thoughts  . No Known Allergies
